# Patient Record
Sex: Female | Born: 1990 | Race: White | Hispanic: No | Marital: Single | Smoking: Never smoker
Health system: Southern US, Community
[De-identification: ages and names within clinical notes are randomized; demographics above are authoritative.]

## PROBLEM LIST (undated history)

## (undated) DIAGNOSIS — Z98891 History of uterine scar from previous surgery: Secondary | ICD-10-CM

## (undated) DIAGNOSIS — Z3491 Encounter for supervision of normal pregnancy, unspecified, first trimester: Principal | ICD-10-CM

## (undated) DIAGNOSIS — Z789 Other specified health status: Secondary | ICD-10-CM

## (undated) HISTORY — DX: History of uterine scar from previous surgery: Z98.891

## (undated) HISTORY — DX: Encounter for supervision of normal pregnancy, unspecified, first trimester: Z34.91

---

## 2013-12-10 ENCOUNTER — Telehealth: Payer: Self-pay | Admitting: Family Medicine

## 2013-12-10 NOTE — Telephone Encounter (Signed)
appt scheduled

## 2014-01-25 ENCOUNTER — Ambulatory Visit (INDEPENDENT_AMBULATORY_CARE_PROVIDER_SITE_OTHER): Payer: Medicaid Other | Admitting: Nurse Practitioner

## 2014-01-25 ENCOUNTER — Encounter (INDEPENDENT_AMBULATORY_CARE_PROVIDER_SITE_OTHER): Payer: Self-pay

## 2014-01-25 ENCOUNTER — Encounter: Payer: Self-pay | Admitting: Nurse Practitioner

## 2014-01-25 VITALS — BP 132/78 | HR 84 | Temp 98.6°F | Ht 63.75 in | Wt 237.0 lb

## 2014-01-25 DIAGNOSIS — Z713 Dietary counseling and surveillance: Secondary | ICD-10-CM

## 2014-01-25 DIAGNOSIS — Z0000 Encounter for general adult medical examination without abnormal findings: Secondary | ICD-10-CM

## 2014-01-25 DIAGNOSIS — N926 Irregular menstruation, unspecified: Secondary | ICD-10-CM

## 2014-01-25 DIAGNOSIS — R635 Abnormal weight gain: Secondary | ICD-10-CM

## 2014-01-25 DIAGNOSIS — Z01419 Encounter for gynecological examination (general) (routine) without abnormal findings: Secondary | ICD-10-CM

## 2014-01-25 LAB — POCT URINALYSIS DIPSTICK
Bilirubin, UA: NEGATIVE
Blood, UA: NEGATIVE
Glucose, UA: NEGATIVE
Ketones, UA: NEGATIVE
Leukocytes, UA: NEGATIVE
Nitrite, UA: NEGATIVE
PH UA: 5
PROTEIN UA: NEGATIVE
SPEC GRAV UA: 1.02
UROBILINOGEN UA: NEGATIVE

## 2014-01-25 LAB — POCT UA - MICROSCOPIC ONLY
BACTERIA, U MICROSCOPIC: NEGATIVE
CRYSTALS, UR, HPF, POC: NEGATIVE
Casts, Ur, LPF, POC: NEGATIVE
Mucus, UA: NEGATIVE
RBC, URINE, MICROSCOPIC: NEGATIVE
WBC, Ur, HPF, POC: NEGATIVE
Yeast, UA: NEGATIVE

## 2014-01-25 LAB — POCT HEMOGLOBIN: Hemoglobin: 14.3 g/dL (ref 12.2–16.2)

## 2014-01-25 MED ORDER — LEVONORGESTREL-ETHINYL ESTRAD 0.1-20 MG-MCG PO TABS: 1.0000 | ORAL_TABLET | Freq: Every day | ORAL | Status: DC

## 2014-01-25 NOTE — Patient Instructions (Signed)
Exercise to Lose Weight Exercise and a healthy diet may help you lose weight. Your doctor may suggest specific exercises. EXERCISE IDEAS AND TIPS  Choose low-cost things you enjoy doing, such as walking, bicycling, or exercising to workout videos.  Take stairs instead of the elevator.  Walk during your lunch break.  Park your car further away from work or school.  Go to a gym or an exercise class.  Start with 5 to 10 minutes of exercise each day. Build up to 30 minutes of exercise 4 to 6 days a week.  Wear shoes with good support and comfortable clothes.  Stretch before and after working out.  Work out until you breathe harder and your heart beats faster.  Drink extra water when you exercise.  Do not do so much that you hurt yourself, feel dizzy, or get very short of breath. Exercises that burn about 150 calories:  Running 1  miles in 15 minutes.  Playing volleyball for 45 to 60 minutes.  Washing and waxing a car for 45 to 60 minutes.  Playing touch football for 45 minutes.  Walking 1  miles in 35 minutes.  Pushing a stroller 1  miles in 30 minutes.  Playing basketball for 30 minutes.  Raking leaves for 30 minutes.  Bicycling 5 miles in 30 minutes.  Walking 2 miles in 30 minutes.  Dancing for 30 minutes.  Shoveling snow for 15 minutes.  Swimming laps for 20 minutes.  Walking up stairs for 15 minutes.  Bicycling 4 miles in 15 minutes.  Gardening for 30 to 45 minutes.  Jumping rope for 15 minutes.  Washing windows or floors for 45 to 60 minutes. Document Released: 06/08/2010 Document Revised: 07/29/2011 Document Reviewed: 06/08/2010 [employer] Patient Information 2015 [employer]. This information is not intended to replace advice given to you by your health care provider. Make sure you discuss any questions you have with your health care provider.  

## 2014-01-25 NOTE — Progress Notes (Signed)
Subjective:    Patient ID: Julie Estes, female    DOB: 23-Apr-1991, 23 y.o.   MRN: [id number]  HPI Patient here for a complete physical with pap.  Had last menstrual cycle in July 2015 that last 1 week and has not had a menstrual cycle since then.  Has had bilateral lower quadrant pain for the past few weeks.  Has not had regular periods since the age of 23.  Was on the birth control pill for about one year and then stopped to get pregnant and has a 34 year old son and currently not using any birth control.  Has had a 75 lb. Weight gain since having her son.   Last pap smear was at age 62 with no abnormal results.    Review of Systems  Constitutional: Positive for unexpected weight change.  Respiratory: Negative.   Cardiovascular: Negative.   Gastrointestinal: Negative.   Musculoskeletal: Negative.   Neurological: Negative.   Psychiatric/Behavioral: Negative.        Objective:   Physical Exam  Constitutional: She is oriented to person, place, and time. She appears well-developed and well-nourished.  HENT:  Head: Normocephalic.  Right Ear: Hearing, tympanic membrane, external ear and ear canal normal.  Left Ear: Hearing, tympanic membrane, external ear and ear canal normal.  Nose: Nose normal.  Mouth/Throat: Uvula is midline and oropharynx is clear and moist.  Eyes: Conjunctivae and EOM are normal. Pupils are equal, round, and reactive to light.  Neck: Normal range of motion and full passive range of motion without pain. Neck supple. No JVD present. Carotid bruit is not present. No mass and no thyromegaly present.  Cardiovascular: Normal rate, normal heart sounds and intact distal pulses.   No murmur heard. Pulmonary/Chest: Effort normal and breath sounds normal. Right breast exhibits no inverted nipple, no mass, no nipple discharge, no skin change and no tenderness. Left breast exhibits no inverted nipple, no mass, no nipple discharge, no skin change and no tenderness.    Abdominal: Soft. Bowel sounds are normal. She exhibits no mass. There is no tenderness.  Genitourinary: Vagina normal and uterus normal. No breast swelling, tenderness, discharge or bleeding.  bimanual exam-No adnexal masses or tenderness. Cervix nonparous and pink  Musculoskeletal: Normal range of motion.  Lymphadenopathy:    She has no cervical adenopathy.  Neurological: She is alert and oriented to person, place, and time.  Skin: Skin is warm and dry.  Psychiatric: She has a normal mood and affect. Her behavior is normal. Judgment and thought content normal.    BP 132/78  Pulse 84  Temp(Src) 98.6 F (37 C) (Oral)  Ht 5' 3.75" (1.619 m)  Wt 237 lb (107.502 kg)  BMI 41.01 kg/m2  LMP 12/13/2013       Assessment & Plan:   1. Annual physical exam   2. Encounter for routine gynecological examination   3. Severe obesity (BMI >= 40)   4. Irregular menses   5. Weight loss counseling, encounter for    Orders Placed This Encounter  Procedures  . POCT urinalysis dipstick  . POCT UA - Microscopic Only  . POCT hemoglobin   Meds ordered this encounter  Medications  . levonorgestrel-ethinyl estradiol (AVIANE) 0.1-20 MG-MCG tablet    Sig: Take 1 tablet by mouth daily.    Dispense:  1 Package    Refill:  11    Order Specific Question:  Supervising Provider    Answer:  Ernestina Penna [[id number]]   Patient told to  take birth control for 3 months- then stop and try to get pregnant agian Discussed weight management for patient with BMI> 25 Labs pending Health maintenance reviewed Diet and exercise encouraged Continue all meds Follow up  In 1 year   Mary-Margaret Daphine Deutscher, FNP

## 2014-01-26 LAB — THYROID PANEL WITH TSH
FREE THYROXINE INDEX: 2.6 (ref 1.2–4.9)
T3 UPTAKE RATIO: 20 % — AB (ref 24–39)
T4 TOTAL: 13.1 ug/dL — AB (ref 4.5–12.0)
TSH: 2.04 u[IU]/mL (ref 0.450–4.500)

## 2014-01-26 LAB — HCG, QUANTITATIVE, PREGNANCY: hCG Quant: <1 m[IU]/mL

## 2014-01-27 LAB — PAP IG, CT-NG, RFX HPV ASCU
Chlamydia, Nuc. Acid Amp: NEGATIVE
Gonococcus by Nucleic Acid Amp: NEGATIVE
PAP SMEAR COMMENT: 0

## 2014-06-09 ENCOUNTER — Telehealth: Payer: Self-pay | Admitting: Nurse Practitioner

## 2014-06-13 ENCOUNTER — Ambulatory Visit: Payer: Medicaid Other | Admitting: Nurse Practitioner

## 2014-06-13 ENCOUNTER — Telehealth: Payer: Self-pay | Admitting: Nurse Practitioner

## 2014-06-13 NOTE — Telephone Encounter (Signed)
Appointment rescheduled for tomorrow with Paulene FloorMary Martin, FNP.

## 2014-06-13 NOTE — Telephone Encounter (Signed)
Scheduled in another encounter. 

## 2014-06-14 ENCOUNTER — Encounter: Payer: Self-pay | Admitting: Nurse Practitioner

## 2014-06-14 ENCOUNTER — Ambulatory Visit (INDEPENDENT_AMBULATORY_CARE_PROVIDER_SITE_OTHER): Payer: Medicaid Other | Admitting: Nurse Practitioner

## 2014-06-14 VITALS — BP 116/80 | HR 86 | Temp 97.1°F | Ht 63.0 in | Wt 235.0 lb

## 2014-06-14 DIAGNOSIS — B373 Candidiasis of vulva and vagina: Secondary | ICD-10-CM

## 2014-06-14 DIAGNOSIS — Z09 Encounter for follow-up examination after completed treatment for conditions other than malignant neoplasm: Secondary | ICD-10-CM

## 2014-06-14 DIAGNOSIS — R300 Dysuria: Secondary | ICD-10-CM

## 2014-06-14 DIAGNOSIS — B3731 Acute candidiasis of vulva and vagina: Secondary | ICD-10-CM

## 2014-06-14 DIAGNOSIS — N3000 Acute cystitis without hematuria: Secondary | ICD-10-CM

## 2014-06-14 DIAGNOSIS — S322XXD Fracture of coccyx, subsequent encounter for fracture with routine healing: Secondary | ICD-10-CM

## 2014-06-14 LAB — POCT URINALYSIS DIPSTICK
Bilirubin, UA: NEGATIVE
GLUCOSE UA: NEGATIVE
KETONES UA: NEGATIVE
NITRITE UA: NEGATIVE
RBC UA: NEGATIVE
UROBILINOGEN UA: NEGATIVE
pH, UA: 6.5

## 2014-06-14 LAB — POCT UA - MICROSCOPIC ONLY
Casts, Ur, LPF, POC: NEGATIVE
Crystals, Ur, HPF, POC: NEGATIVE
RBC, urine, microscopic: NEGATIVE
WBC, Ur, HPF, POC: NEGATIVE

## 2014-06-14 MED ORDER — FLUCONAZOLE 150 MG PO TABS: 150.0000 mg | ORAL_TABLET | Freq: Once | ORAL | Status: DC

## 2014-06-14 MED ORDER — NITROFURANTOIN MONOHYD MACRO 100 MG PO CAPS: 100.0000 mg | ORAL_CAPSULE | Freq: Two times a day (BID) | ORAL | Status: DC

## 2014-06-14 MED ORDER — HYDROCODONE-ACETAMINOPHEN 5-325 MG PO TABS: 1.0000 | ORAL_TABLET | Freq: Four times a day (QID) | ORAL | Status: DC | PRN

## 2014-06-14 NOTE — Progress Notes (Signed)
   Subjective:    Patient ID: Julie ShortsSamantha Estes, female    DOB: Jan 17, 1991, 24 y.o.   MRN: [id number]  HPI [facility] follow [facility] She had fallen out of a big truck and hit  Her tail bone on running board- Diagnosed with coccyx fracture and was given pain pills. She is still having pain. Pain is worse with sitting or laying down  *Also c/o dysuria and urgency- Started yesterday- Had to get up in the middle og night to void which is unusual for her. Denies fever or pelvic pain.  Review of Systems  Constitutional: Negative.   HENT: Negative.   Respiratory: Negative.   Cardiovascular: Negative.   Genitourinary: Positive for dysuria, urgency and frequency. Negative for flank pain.  Neurological: Negative.   Psychiatric/Behavioral: Negative.   All other systems reviewed and are negative.      Objective:   Physical Exam  Constitutional: She is oriented to person, place, and time. She appears well-developed and well-nourished.  Cardiovascular: Normal rate, regular rhythm and normal heart sounds.   Pulmonary/Chest: Effort normal and breath sounds normal.  Abdominal: Soft. Bowel sounds are normal. There is no tenderness.  Genitourinary:  No CVA tenderness  Musculoskeletal:  Tenderness along coccyx bone on very light palpation  Neurological: She is alert and oriented to person, place, and time.  Skin: Skin is warm and dry.  Psychiatric: She has a normal mood and affect. Her behavior is normal. Judgment and thought content normal.   BP 116/80 mmHg  Pulse 86  Temp(Src) 97.1 F (36.2 C) (Oral)  Ht 5\' 3"  (1.6 m)  Wt 235 lb (106.595 kg)  BMI 41.64 kg/m2        Assessment & Plan:  1. Dysuria - POCT UA - Microscopic Only - POCT urinalysis dipstick  2. Acute cystitis without hematuria Force fluids AZO over the counter X2 days RTO prn Culture pending - nitrofurantoin, macrocrystal-monohydrate, (MACROBID) 100 MG capsule;  Take 1 capsule (100 mg total) by mouth 2 (two) times daily.  Dispense: 14 capsule; Refill: 0  3. Vaginal candidiasis - fluconazole (DIFLUCAN) 150 MG tablet; Take 1 tablet (150 mg total) by mouth once.  Dispense: 1 tablet; Refill: 0  4. Fracture of coccyx, with routine healing, subsequent encounter Get a donut to sit on - HYDROcodone-acetaminophen (LORTAB) 5-325 MG per tablet; Take 1 tablet by mouth every 6 (six) hours as needed for moderate pain.  Dispense: 40 tablet; Refill: 0  5. Hospital discharge follow-up Hospital records reviewed  Mary-Margaret Oak CreekMartin, [place]NP

## 2014-06-14 NOTE — Patient Instructions (Signed)

## 2014-07-13 ENCOUNTER — Ambulatory Visit (INDEPENDENT_AMBULATORY_CARE_PROVIDER_SITE_OTHER): Payer: Medicaid Other | Admitting: Family

## 2014-07-13 ENCOUNTER — Encounter: Payer: Self-pay | Admitting: Family

## 2014-07-13 VITALS — BP 120/74 | HR 76 | Temp 97.5°F | Ht 63.0 in | Wt 232.0 lb

## 2014-07-13 DIAGNOSIS — N912 Amenorrhea, unspecified: Secondary | ICD-10-CM

## 2014-07-13 DIAGNOSIS — Z3490 Encounter for supervision of normal pregnancy, unspecified, unspecified trimester: Secondary | ICD-10-CM

## 2014-07-13 LAB — POCT URINE PREGNANCY: Preg Test, Ur: POSITIVE

## 2014-07-13 NOTE — Patient Instructions (Signed)
Second Trimester of Pregnancy The second trimester is from week 13 through week 28, months 4 through 6. The second trimester is often a time when you feel your best. Your body has also adjusted to being pregnant, and you begin to feel better physically. Usually, morning sickness has lessened or quit completely, you may have more energy, and you may have an increase in appetite. The second trimester is also a time when the fetus is growing rapidly. At the end of the sixth month, the fetus is about 9 inches long and weighs about 1 pounds. You will likely begin to feel the baby move (quickening) between 18 and 20 weeks of the pregnancy. BODY CHANGES Your body goes through many changes during pregnancy. The changes vary from woman to woman.   Your weight will continue to increase. You will notice your lower abdomen bulging out.  You may begin to get stretch marks on your hips, abdomen, and breasts.  You may develop headaches that can be relieved by medicines approved by your health care provider.  You may urinate more often because the fetus is pressing on your bladder.  You may develop or continue to have heartburn as a result of your pregnancy.  You may develop constipation because certain hormones are causing the muscles that push waste through your intestines to slow down.  You may develop hemorrhoids or swollen, bulging veins (varicose veins).  You may have back pain because of the weight gain and pregnancy hormones relaxing your joints between the bones in your pelvis and as a result of a shift in weight and the muscles that support your balance.  Your breasts will continue to grow and be tender.  Your gums may bleed and may be sensitive to brushing and flossing.  Dark spots or blotches (chloasma, mask of pregnancy) may develop on your face. This will likely fade after the baby is born.  A dark line from your belly button to the pubic area (linea nigra) may appear. This will likely fade  after the baby is born.  You may have changes in your hair. These can include thickening of your hair, rapid growth, and changes in texture. Some women also have hair loss during or after pregnancy, or hair that feels dry or thin. Your hair will most likely return to normal after your baby is born. WHAT TO EXPECT AT YOUR PRENATAL VISITS During a routine prenatal visit:  You will be weighed to make sure you and the fetus are growing normally.  Your blood pressure will be taken.  Your abdomen will be measured to track your baby's growth.  The fetal heartbeat will be listened to.  Any test results from the previous visit will be discussed. Your health care provider may ask you:  How you are feeling.  If you are feeling the baby move.  If you have had any abnormal symptoms, such as leaking fluid, bleeding, severe headaches, or abdominal cramping.  If you have any questions. Other tests that may be performed during your second trimester include:  Blood tests that check for:  Low iron levels (anemia).  Gestational diabetes (between 24 and 28 weeks).  Rh antibodies.  Urine tests to check for infections, diabetes, or protein in the urine.  An ultrasound to confirm the proper growth and development of the baby.  An amniocentesis to check for possible genetic problems.  Fetal screens for spina bifida and Down syndrome. HOME CARE INSTRUCTIONS   Avoid all smoking, herbs, alcohol, and unprescribed   drugs. These chemicals affect the formation and growth of the baby.  Follow your health care provider's instructions regarding medicine use. There are medicines that are either safe or unsafe to take during pregnancy.  Exercise only as directed by your health care provider. Experiencing uterine cramps is a good sign to stop exercising.  Continue to eat regular, healthy meals.  Wear a good support bra for breast tenderness.  Do not use hot tubs, steam rooms, or saunas.  Wear your  seat belt at all times when driving.  Avoid raw meat, uncooked cheese, cat litter boxes, and soil used by cats. These carry germs that can cause birth defects in the baby.  Take your prenatal vitamins.  Try taking a stool softener (if your health care provider approves) if you develop constipation. Eat more high-fiber foods, such as fresh vegetables or fruit and whole grains. Drink plenty of fluids to keep your urine clear or pale yellow.  Take warm sitz baths to soothe any pain or discomfort caused by hemorrhoids. Use hemorrhoid cream if your health care provider approves.  If you develop varicose veins, wear support hose. Elevate your feet for 15 minutes, 3-4 times a day. Limit salt in your diet.  Avoid heavy lifting, wear low heel shoes, and practice good posture.  Rest with your legs elevated if you have leg cramps or low back pain.  Visit your dentist if you have not gone yet during your pregnancy. Use a soft toothbrush to brush your teeth and be gentle when you floss.  A sexual relationship may be continued unless your health care provider directs you otherwise.  Continue to go to all your prenatal visits as directed by your health care provider. SEEK MEDICAL CARE IF:   You have dizziness.  You have mild pelvic cramps, pelvic pressure, or nagging pain in the abdominal area.  You have persistent nausea, vomiting, or diarrhea.  You have a bad smelling vaginal discharge.  You have pain with urination. SEEK IMMEDIATE MEDICAL CARE IF:   You have a fever.  You are leaking fluid from your vagina.  You have spotting or bleeding from your vagina.  You have severe abdominal cramping or pain.  You have rapid weight gain or loss.  You have shortness of breath with chest pain.  You notice sudden or extreme swelling of your face, hands, ankles, feet, or legs.  You have not felt your baby move in over an hour.  You have severe headaches that do not go away with  medicine.  You have vision changes. Document Released: 04/30/2001 Document Revised: 05/11/2013 Document Reviewed: 07/07/2012 [facility] Patient Information 2015 [place], [place]. This information is not intended to replace advice given to you by your health care provider. Make sure you discuss any questions you have with your health care provider. First Trimester of Pregnancy The first trimester of pregnancy is from week 1 until the end of week 12 (months 1 through 3). A week after a sperm fertilizes an egg, the egg will implant on the wall of the uterus. This embryo will begin to develop into a baby. Genes from you and your partner are forming the baby. The female genes determine whether the baby is a boy or a girl. At 6-8 weeks, the eyes and face are formed, and the heartbeat can be seen on ultrasound. At the end of 12 weeks, all the baby's organs are formed.  Now that you are pregnant, you will want to do everything you can to have  a healthy baby. Two of the most important things are to get good prenatal care and to follow your health care provider's instructions. Prenatal care is all the medical care you receive before the baby's birth. This care will help prevent, find, and treat any problems during the pregnancy and childbirth. BODY CHANGES Your body goes through many changes during pregnancy. The changes vary from woman to woman.   You may gain or lose a couple of pounds at first.  You may feel sick to your stomach (nauseous) and throw up (vomit). If the vomiting is uncontrollable, call your health care provider.  You may tire easily.  You may develop headaches that can be relieved by medicines approved by your health care provider.  You may urinate more often. Painful urination may mean you have a bladder infection.  You may develop heartburn as a result of your pregnancy.  You may develop constipation because certain hormones are causing the muscles that push waste through your intestines  to slow down.  You may develop hemorrhoids or swollen, bulging veins (varicose veins).  Your breasts may begin to grow larger and become tender. Your nipples may stick out more, and the tissue that surrounds them (areola) may become darker.  Your gums may bleed and may be sensitive to brushing and flossing.  Dark spots or blotches (chloasma, mask of pregnancy) may develop on your face. This will likely fade after the baby is born.  Your menstrual periods will stop.  You may have a loss of appetite.  You may develop cravings for certain kinds of food.  You may have changes in your emotions from day to day, such as being excited to be pregnant or being concerned that something may go wrong with the pregnancy and baby.  You may have more vivid and strange dreams.  You may have changes in your hair. These can include thickening of your hair, rapid growth, and changes in texture. Some women also have hair loss during or after pregnancy, or hair that feels dry or thin. Your hair will most likely return to normal after your baby is born. WHAT TO EXPECT AT YOUR PRENATAL VISITS During a routine prenatal visit:  You will be weighed to make sure you and the baby are growing normally.  Your blood pressure will be taken.  Your abdomen will be measured to track your baby's growth.  The fetal heartbeat will be listened to starting around week 10 or 12 of your pregnancy.  Test results from any previous visits will be discussed. Your health care provider may ask you:  How you are feeling.  If you are feeling the baby move.  If you have had any abnormal symptoms, such as leaking fluid, bleeding, severe headaches, or abdominal cramping.  If you have any questions. Other tests that may be performed during your first trimester include:  Blood tests to find your blood type and to check for the presence of any previous infections. They will also be used to check for low iron levels (anemia) and  Rh antibodies. Later in the pregnancy, blood tests for diabetes will be done along with other tests if problems develop.  Urine tests to check for infections, diabetes, or protein in the urine.  An ultrasound to confirm the proper growth and development of the baby.  An amniocentesis to check for possible genetic problems.  Fetal screens for spina bifida and Down syndrome.  You may need other tests to make sure you and the baby  are doing well. HOME CARE INSTRUCTIONS  Medicines  Follow your health care provider's instructions regarding medicine use. Specific medicines may be either safe or unsafe to take during pregnancy.  Take your prenatal vitamins as directed.  If you develop constipation, try taking a stool softener if your health care provider approves. Diet  Eat regular, well-balanced meals. Choose a variety of foods, such as meat or vegetable-based protein, fish, milk and low-fat dairy products, vegetables, fruits, and whole grain breads and cereals. Your health care provider will help you determine the amount of weight gain that is right for you.  Avoid raw meat and uncooked cheese. These carry germs that can cause birth defects in the baby.  Eating four or five small meals rather than three large meals a day may help relieve nausea and vomiting. If you start to feel nauseous, eating a few soda crackers can be helpful. Drinking liquids between meals instead of during meals also seems to help nausea and vomiting.  If you develop constipation, eat more high-fiber foods, such as fresh vegetables or fruit and whole grains. Drink enough fluids to keep your urine clear or pale yellow. Activity and Exercise  Exercise only as directed by your health care provider. Exercising will help you:  Control your weight.  Stay in shape.  Be prepared for labor and delivery.  Experiencing pain or cramping in the lower abdomen or low back is a good sign that you should stop exercising. Check  with your health care provider before continuing normal exercises.  Try to avoid standing for long periods of time. Move your legs often if you must stand in one place for a long time.  Avoid heavy lifting.  Wear low-heeled shoes, and practice good posture.  You may continue to have sex unless your health care provider directs you otherwise. Relief of Pain or Discomfort  Wear a good support bra for breast tenderness.   Take warm sitz baths to soothe any pain or discomfort caused by hemorrhoids. Use hemorrhoid cream if your health care provider approves.   Rest with your legs elevated if you have leg cramps or low back pain.  If you develop varicose veins in your legs, wear support hose. Elevate your feet for 15 minutes, 3-4 times a day. Limit salt in your diet. Prenatal Care  Schedule your prenatal visits by the twelfth week of pregnancy. They are usually scheduled monthly at first, then more often in the last 2 months before delivery.  Write down your questions. Take them to your prenatal visits.  Keep all your prenatal visits as directed by your health care provider. Safety  Wear your seat belt at all times when driving.  Make a list of emergency phone numbers, including numbers for family, friends, the hospital, and police and fire departments. General Tips  Ask your health care provider for a referral to a local prenatal education class. Begin classes no later than at the beginning of month 6 of your pregnancy.  Ask for help if you have counseling or nutritional needs during pregnancy. Your health care provider can offer advice or refer you to specialists for help with various needs.  Do not use hot tubs, steam rooms, or saunas.  Do not douche or use tampons or scented sanitary pads.  Do not cross your legs for long periods of time.  Avoid cat litter boxes and soil used by cats. These carry germs that can cause birth defects in the baby and possibly loss of the fetus  by miscarriage or stillbirth.  Avoid all smoking, herbs, alcohol, and medicines not prescribed by your health care provider. Chemicals in these affect the formation and growth of the baby.  Schedule a dentist appointment. At home, brush your teeth with a soft toothbrush and be gentle when you floss. SEEK MEDICAL CARE IF:   You have dizziness.  You have mild pelvic cramps, pelvic pressure, or nagging pain in the abdominal area.  You have persistent nausea, vomiting, or diarrhea.  You have a bad smelling vaginal discharge.  You have pain with urination.  You notice increased swelling in your face, hands, legs, or ankles. SEEK IMMEDIATE MEDICAL CARE IF:   You have a fever.  You are leaking fluid from your vagina.  You have spotting or bleeding from your vagina.  You have severe abdominal cramping or pain.  You have rapid weight gain or loss.  You vomit blood or material that looks like coffee grounds.  You are exposed to German measles and have never had them.  You are exposed to fifth disease or chickenpox.  You develop a severe headache.  You have shortness of breath.  You have any kind of trauma, such as from a fall or a car accident. Document Released: 04/30/2001 Document Revised: 09/20/2013 Document Reviewed: 03/16/2013 [employer] Patient Information 2015 [employer]. This information is not intended to replace advice given to you by your health care provider. Make sure you discuss any questions you have with your health care provider.  

## 2014-07-13 NOTE — Progress Notes (Signed)
   Subjective:    Patient ID: Julie ShortsSamantha Estes, female    DOB: Feb 05, 1991, 24 y.o.   MRN: [id number]  HPI Pt presents to the office for two home positive pregnancy tests. Pt states she needs a referral to a OB GYN. Pt states she is taking prenatal vitamins. Pt states her periods are irregular and does not know how far along she is. States her last periord was Dec. 18.   Review of Systems  Constitutional: Negative.   HENT: Negative.   Eyes: Negative.   Respiratory: Negative.  Negative for shortness of breath.   Cardiovascular: Negative.  Negative for palpitations.  Gastrointestinal: Negative.   Endocrine: Negative.   Genitourinary: Negative.   Musculoskeletal: Negative.   Neurological: Negative.  Negative for headaches.  Hematological: Negative.   Psychiatric/Behavioral: Negative.   All other systems reviewed and are negative.      Objective:   Physical Exam  Constitutional: She is oriented to person, place, and time. She appears well-developed and well-nourished. No distress.  HENT:  Head: Normocephalic and atraumatic.  Right Ear: External ear normal.  Left Ear: External ear normal.  Nose: Nose normal.  Mouth/Throat: Oropharynx is clear and moist.  Eyes: Pupils are equal, round, and reactive to light.  Neck: Normal range of motion. Neck supple. No thyromegaly present.  Cardiovascular: Normal rate, regular rhythm, normal heart sounds and intact distal pulses.   No murmur heard. Pulmonary/Chest: Effort normal and breath sounds normal. No respiratory distress. She has no wheezes.  Abdominal: Soft. Bowel sounds are normal.  Musculoskeletal: Normal range of motion. She exhibits no edema or tenderness.  Neurological: She is alert and oriented to person, place, and time. She has normal reflexes. No cranial nerve deficit.  Skin: Skin is warm and dry.  Psychiatric: She has a normal mood and affect. Her behavior is normal. Judgment and thought content normal.  Vitals reviewed.   BP  120/74 mmHg  Pulse 76  Temp(Src) 97.5 F (36.4 C) (Oral)  Ht 5\' 3"  (1.6 m)  Wt 232 lb (105.235 kg)  BMI 41.11 kg/m2  LMP 07/11/2013 (Within Days)       Assessment & Plan:  1. Amenorrhea - POCT urine pregnancy - Ambulatory referral to Obstetrics / Gynecology  2. Pregnant -Continue prenatal vitamins -Discussed healthy diet and medications to avoid -Keep OB GYN appt - Ambulatory referral to Obstetrics / Gynecology  Jannifer Rodneyhristy Valmai Vandenberghe, FNP

## 2014-07-14 ENCOUNTER — Other Ambulatory Visit: Payer: Self-pay | Admitting: Obstetrics and Gynecology

## 2014-07-14 DIAGNOSIS — O3680X0 Pregnancy with inconclusive fetal viability, not applicable or unspecified: Secondary | ICD-10-CM

## 2014-07-18 ENCOUNTER — Ambulatory Visit (INDEPENDENT_AMBULATORY_CARE_PROVIDER_SITE_OTHER): Payer: Medicaid Other

## 2014-07-18 ENCOUNTER — Other Ambulatory Visit: Payer: Self-pay | Admitting: Obstetrics and Gynecology

## 2014-07-18 DIAGNOSIS — O3680X0 Pregnancy with inconclusive fetal viability, not applicable or unspecified: Secondary | ICD-10-CM

## 2014-07-18 DIAGNOSIS — O26841 Uterine size-date discrepancy, first trimester: Secondary | ICD-10-CM

## 2014-07-18 NOTE — Progress Notes (Signed)
U/S-single IUP with +FCA noted, FHR-168 bpm, CRL c/w 8+4 wks EDD 02/23/2015, cx appears closed, bilateral adnexa appears WNL with C.L. Noted on the Rt

## 2014-08-01 ENCOUNTER — Encounter: Payer: Medicaid Other | Admitting: Women's Health

## 2014-08-04 ENCOUNTER — Ambulatory Visit (INDEPENDENT_AMBULATORY_CARE_PROVIDER_SITE_OTHER): Payer: Medicaid Other | Admitting: Adult Health

## 2014-08-04 ENCOUNTER — Encounter: Payer: Self-pay | Admitting: Adult Health

## 2014-08-04 VITALS — BP 122/68 | HR 80 | Wt 230.5 lb

## 2014-08-04 DIAGNOSIS — Z3491 Encounter for supervision of normal pregnancy, unspecified, first trimester: Secondary | ICD-10-CM

## 2014-08-04 DIAGNOSIS — Z0283 Encounter for blood-alcohol and blood-drug test: Secondary | ICD-10-CM

## 2014-08-04 DIAGNOSIS — Z369 Encounter for antenatal screening, unspecified: Secondary | ICD-10-CM

## 2014-08-04 DIAGNOSIS — Z331 Pregnant state, incidental: Secondary | ICD-10-CM

## 2014-08-04 DIAGNOSIS — Z3481 Encounter for supervision of other normal pregnancy, first trimester: Secondary | ICD-10-CM

## 2014-08-04 DIAGNOSIS — Z1389 Encounter for screening for other disorder: Secondary | ICD-10-CM

## 2014-08-04 DIAGNOSIS — Z98891 History of uterine scar from previous surgery: Secondary | ICD-10-CM

## 2014-08-04 HISTORY — DX: Encounter for supervision of normal pregnancy, unspecified, first trimester: Z34.91

## 2014-08-04 HISTORY — DX: History of uterine scar from previous surgery: Z98.891

## 2014-08-04 LAB — POCT URINALYSIS DIPSTICK
Glucose, UA: NEGATIVE
Ketones, UA: NEGATIVE
LEUKOCYTES UA: NEGATIVE
Nitrite, UA: NEGATIVE
Protein, UA: NEGATIVE

## 2014-08-04 NOTE — Progress Notes (Signed)
Subjective:  Julie Estes is a 24 y.o. 952P1001 Caucasian female at 5544w0d by US being seen today for her first obstetrical visit.  Her obstetrical history is significant for obesity.  Pregnancy history fully reviewed.  Patient reports no complaints. Denies vb, cramping, uti s/s, abnormal/malodorous vag d/c, or vulvovaginal itching/irritation.  BP 122/68 mmHg  Pulse 80  Wt 230 lb 8 oz (104.554 kg)  LMP 05/06/2014 (Approximate)  HISTORY: OB History  Gravida Para Term Preterm AB SAB TAB Ectopic Multiple Living  2 1 1       1     # Outcome Date GA Lbr Len/2nd Weight Sex Delivery Anes PTL Lv  2 Current           1 Term 03/28/10 3948w0d  8 lb 7.1 oz (3.83 kg) M CS-Unspec   Y     Past Medical History  Diagnosis Date  . Supervision of normal pregnancy in first trimester 08/04/2014  . History of cesarean section 08/04/2014   Past Surgical History  Procedure Laterality Date  . Cesarean section  03/28/2010   Family History  Problem Relation Age of Onset  . Lung disease Father   . Other Mother     herpes  . Hypertension Brother   . Cancer Maternal Grandmother     ovarian  . COPD Maternal Grandfather   . Stroke Paternal Grandmother   . Heart disease Paternal Grandfather     Exam   System:     General: Well developed & nourished, no acute distress   Skin: Warm & dry, normal coloration and turgor, no rashes   Neurologic: Alert & oriented, normal mood   Cardiovascular: Regular rate & rhythm   Respiratory: Effort & rate normal, LCTAB, acyanotic   Abdomen: Soft, non tender   Extremities: normal strength, tone                                        Thyroid normal Pelvic Exam:    Perineum: deferred   Vulva: deferred   Vagina:  deferred   Cervix: deferred   Uterus: deferred   Thin prep pap smear done 01/25/14 was normal FHR: 160 via doppler   Assessment:   Pregnancy: G2P1001 Patient Active Problem List   Diagnosis Date Noted  . Supervision of normal pregnancy in first  trimester 08/04/2014  . History of cesarean section 08/04/2014    4244w0d G2P1001 New OB visit     Plan:  Initial labs drawn Continue prenatal vitamins Problem list reviewed and updated Reviewed n/v relief measures and warning s/s to report Reviewed recommended weight gain based on pre-gravid BMI Encouraged well-balanced diet Genetic Screening discussed Integrated Screen: requested Cystic fibrosis screening discussed requested Ultrasound discussed; fetal survey: requested Follow up in 1 weeks for  IT/NT then in 4 weeks for second IT and see Julie Estes  Julie Geske A. Julie Niebla, NP 08/04/2014 12:28 PM

## 2014-08-04 NOTE — Patient Instructions (Signed)
Second Trimester of Pregnancy The second trimester is from week 13 through week 28, months 4 through 6. The second trimester is often a time when you feel your best. Your body has also adjusted to being pregnant, and you begin to feel better physically. Usually, morning sickness has lessened or quit completely, you may have more energy, and you may have an increase in appetite. The second trimester is also a time when the fetus is growing rapidly. At the end of the sixth month, the fetus is about 9 inches long and weighs about 1 pounds. You will likely begin to feel the baby move (quickening) between 18 and 20 weeks of the pregnancy. BODY CHANGES Your body goes through many changes during pregnancy. The changes vary from woman to woman.   Your weight will continue to increase. You will notice your lower abdomen bulging out.  You may begin to get stretch marks on your hips, abdomen, and breasts.  You may develop headaches that can be relieved by medicines approved by your health care provider.  You may urinate more often because the fetus is pressing on your bladder.  You may develop or continue to have heartburn as a result of your pregnancy.  You may develop constipation because certain hormones are causing the muscles that push waste through your intestines to slow down.  You may develop hemorrhoids or swollen, bulging veins (varicose veins).  You may have back pain because of the weight gain and pregnancy hormones relaxing your joints between the bones in your pelvis and as a result of a shift in weight and the muscles that support your balance.  Your breasts will continue to grow and be tender.  Your gums may bleed and may be sensitive to brushing and flossing.  Dark spots or blotches (chloasma, mask of pregnancy) may develop on your face. This will likely fade after the baby is born.  A dark line from your belly button to the pubic area (linea nigra) may appear. This will likely fade  after the baby is born.  You may have changes in your hair. These can include thickening of your hair, rapid growth, and changes in texture. Some women also have hair loss during or after pregnancy, or hair that feels dry or thin. Your hair will most likely return to normal after your baby is born. WHAT TO EXPECT AT YOUR PRENATAL VISITS During a routine prenatal visit:  You will be weighed to make sure you and the fetus are growing normally.  Your blood pressure will be taken.  Your abdomen will be measured to track your baby's growth.  The fetal heartbeat will be listened to.  Any test results from the previous visit will be discussed. Your health care provider may ask you:  How you are feeling.  If you are feeling the baby move.  If you have had any abnormal symptoms, such as leaking fluid, bleeding, severe headaches, or abdominal cramping.  If you have any questions. Other tests that may be performed during your second trimester include:  Blood tests that check for:  Low iron levels (anemia).  Gestational diabetes (between 24 and 28 weeks).  Rh antibodies.  Urine tests to check for infections, diabetes, or protein in the urine.  An ultrasound to confirm the proper growth and development of the baby.  An amniocentesis to check for possible genetic problems.  Fetal screens for spina bifida and Down syndrome. HOME CARE INSTRUCTIONS   Avoid all smoking, herbs, alcohol, and unprescribed   drugs. These chemicals affect the formation and growth of the baby.  Follow your health care provider's instructions regarding medicine use. There are medicines that are either safe or unsafe to take during pregnancy.  Exercise only as directed by your health care provider. Experiencing uterine cramps is a good sign to stop exercising.  Continue to eat regular, healthy meals.  Wear a good support bra for breast tenderness.  Do not use hot tubs, steam rooms, or saunas.  Wear your  seat belt at all times when driving.  Avoid raw meat, uncooked cheese, cat litter boxes, and soil used by cats. These carry germs that can cause birth defects in the baby.  Take your prenatal vitamins.  Try taking a stool softener (if your health care provider approves) if you develop constipation. Eat more high-fiber foods, such as fresh vegetables or fruit and whole grains. Drink plenty of fluids to keep your urine clear or pale yellow.  Take warm sitz baths to soothe any pain or discomfort caused by hemorrhoids. Use hemorrhoid cream if your health care provider approves.  If you develop varicose veins, wear support hose. Elevate your feet for 15 minutes, 3-4 times a day. Limit salt in your diet.  Avoid heavy lifting, wear low heel shoes, and practice good posture.  Rest with your legs elevated if you have leg cramps or low back pain.  Visit your dentist if you have not gone yet during your pregnancy. Use a soft toothbrush to brush your teeth and be gentle when you floss.  A sexual relationship may be continued unless your health care provider directs you otherwise.  Continue to go to all your prenatal visits as directed by your health care provider. SEEK MEDICAL CARE IF:   You have dizziness.  You have mild pelvic cramps, pelvic pressure, or nagging pain in the abdominal area.  You have persistent nausea, vomiting, or diarrhea.  You have a bad smelling vaginal discharge.  You have pain with urination. SEEK IMMEDIATE MEDICAL CARE IF:   You have a fever.  You are leaking fluid from your vagina.  You have spotting or bleeding from your vagina.  You have severe abdominal cramping or pain.  You have rapid weight gain or loss.  You have shortness of breath with chest pain.  You notice sudden or extreme swelling of your face, hands, ankles, feet, or legs.  You have not felt your baby move in over an hour.  You have severe headaches that do not go away with  medicine.  You have vision changes. Document Released: 04/30/2001 Document Revised: 05/11/2013 Document Reviewed: 07/07/2012 [facility] Patient Information 2015 [place], [place]. This information is not intended to replace advice given to you by your health care provider. Make sure you discuss any questions you have with your health care provider. First Trimester of Pregnancy The first trimester of pregnancy is from week 1 until the end of week 12 (months 1 through 3). A week after a sperm fertilizes an egg, the egg will implant on the wall of the uterus. This embryo will begin to develop into a baby. Genes from you and your partner are forming the baby. The female genes determine whether the baby is a boy or a girl. At 6-8 weeks, the eyes and face are formed, and the heartbeat can be seen on ultrasound. At the end of 12 weeks, all the baby's organs are formed.  Now that you are pregnant, you will want to do everything you can to have  a healthy baby. Two of the most important things are to get good prenatal care and to follow your health care provider's instructions. Prenatal care is all the medical care you receive before the baby's birth. This care will help prevent, find, and treat any problems during the pregnancy and childbirth. BODY CHANGES Your body goes through many changes during pregnancy. The changes vary from woman to woman.   You may gain or lose a couple of pounds at first.  You may feel sick to your stomach (nauseous) and throw up (vomit). If the vomiting is uncontrollable, call your health care provider.  You may tire easily.  You may develop headaches that can be relieved by medicines approved by your health care provider.  You may urinate more often. Painful urination may mean you have a bladder infection.  You may develop heartburn as a result of your pregnancy.  You may develop constipation because certain hormones are causing the muscles that push waste through your intestines  to slow down.  You may develop hemorrhoids or swollen, bulging veins (varicose veins).  Your breasts may begin to grow larger and become tender. Your nipples may stick out more, and the tissue that surrounds them (areola) may become darker.  Your gums may bleed and may be sensitive to brushing and flossing.  Dark spots or blotches (chloasma, mask of pregnancy) may develop on your face. This will likely fade after the baby is born.  Your menstrual periods will stop.  You may have a loss of appetite.  You may develop cravings for certain kinds of food.  You may have changes in your emotions from day to day, such as being excited to be pregnant or being concerned that something may go wrong with the pregnancy and baby.  You may have more vivid and strange dreams.  You may have changes in your hair. These can include thickening of your hair, rapid growth, and changes in texture. Some women also have hair loss during or after pregnancy, or hair that feels dry or thin. Your hair will most likely return to normal after your baby is born. WHAT TO EXPECT AT YOUR PRENATAL VISITS During a routine prenatal visit:  You will be weighed to make sure you and the baby are growing normally.  Your blood pressure will be taken.  Your abdomen will be measured to track your baby's growth.  The fetal heartbeat will be listened to starting around week 10 or 12 of your pregnancy.  Test results from any previous visits will be discussed. Your health care provider may ask you:  How you are feeling.  If you are feeling the baby move.  If you have had any abnormal symptoms, such as leaking fluid, bleeding, severe headaches, or abdominal cramping.  If you have any questions. Other tests that may be performed during your first trimester include:  Blood tests to find your blood type and to check for the presence of any previous infections. They will also be used to check for low iron levels (anemia) and  Rh antibodies. Later in the pregnancy, blood tests for diabetes will be done along with other tests if problems develop.  Urine tests to check for infections, diabetes, or protein in the urine.  An ultrasound to confirm the proper growth and development of the baby.  An amniocentesis to check for possible genetic problems.  Fetal screens for spina bifida and Down syndrome.  You may need other tests to make sure you and the baby  are doing well. HOME CARE INSTRUCTIONS  Medicines  Follow your health care provider's instructions regarding medicine use. Specific medicines may be either safe or unsafe to take during pregnancy.  Take your prenatal vitamins as directed.  If you develop constipation, try taking a stool softener if your health care provider approves. Diet  Eat regular, well-balanced meals. Choose a variety of foods, such as meat or vegetable-based protein, fish, milk and low-fat dairy products, vegetables, fruits, and whole grain breads and cereals. Your health care provider will help you determine the amount of weight gain that is right for you.  Avoid raw meat and uncooked cheese. These carry germs that can cause birth defects in the baby.  Eating four or five small meals rather than three large meals a day may help relieve nausea and vomiting. If you start to feel nauseous, eating a few soda crackers can be helpful. Drinking liquids between meals instead of during meals also seems to help nausea and vomiting.  If you develop constipation, eat more high-fiber foods, such as fresh vegetables or fruit and whole grains. Drink enough fluids to keep your urine clear or pale yellow. Activity and Exercise  Exercise only as directed by your health care provider. Exercising will help you:  Control your weight.  Stay in shape.  Be prepared for labor and delivery.  Experiencing pain or cramping in the lower abdomen or low back is a good sign that you should stop exercising. Check  with your health care provider before continuing normal exercises.  Try to avoid standing for long periods of time. Move your legs often if you must stand in one place for a long time.  Avoid heavy lifting.  Wear low-heeled shoes, and practice good posture.  You may continue to have sex unless your health care provider directs you otherwise. Relief of Pain or Discomfort  Wear a good support bra for breast tenderness.   Take warm sitz baths to soothe any pain or discomfort caused by hemorrhoids. Use hemorrhoid cream if your health care provider approves.   Rest with your legs elevated if you have leg cramps or low back pain.  If you develop varicose veins in your legs, wear support hose. Elevate your feet for 15 minutes, 3-4 times a day. Limit salt in your diet. Prenatal Care  Schedule your prenatal visits by the twelfth week of pregnancy. They are usually scheduled monthly at first, then more often in the last 2 months before delivery.  Write down your questions. Take them to your prenatal visits.  Keep all your prenatal visits as directed by your health care provider. Safety  Wear your seat belt at all times when driving.  Make a list of emergency phone numbers, including numbers for family, friends, the hospital, and police and fire departments. General Tips  Ask your health care provider for a referral to a local prenatal education class. Begin classes no later than at the beginning of month 6 of your pregnancy.  Ask for help if you have counseling or nutritional needs during pregnancy. Your health care provider can offer advice or refer you to specialists for help with various needs.  Do not use hot tubs, steam rooms, or saunas.  Do not douche or use tampons or scented sanitary pads.  Do not cross your legs for long periods of time.  Avoid cat litter boxes and soil used by cats. These carry germs that can cause birth defects in the baby and possibly loss of the fetus  by miscarriage or stillbirth.  Avoid all smoking, herbs, alcohol, and medicines not prescribed by your health care provider. Chemicals in these affect the formation and growth of the baby.  Schedule a dentist appointment. At home, brush your teeth with a soft toothbrush and be gentle when you floss. SEEK MEDICAL CARE IF:   You have dizziness.  You have mild pelvic cramps, pelvic pressure, or nagging pain in the abdominal area.  You have persistent nausea, vomiting, or diarrhea.  You have a bad smelling vaginal discharge.  You have pain with urination.  You notice increased swelling in your face, hands, legs, or ankles. SEEK IMMEDIATE MEDICAL CARE IF:   You have a fever.  You are leaking fluid from your vagina.  You have spotting or bleeding from your vagina.  You have severe abdominal cramping or pain.  You have rapid weight gain or loss.  You vomit blood or material that looks like coffee grounds.  You are exposed to [place] measles and have never had them.  You are exposed to fifth disease or chickenpox.  You develop a severe headache.  You have shortness of breath.  You have any kind of trauma, such as from a fall or a car accident. Document Released: 04/30/2001 Document Revised: 09/20/2013 Document Reviewed: 03/16/2013 [facility] Patient Information 2015 [place]. This information is not intended to replace advice given to you by your health care provider. Make sure you discuss any questions you have with your health care provider. Return in 1 week for IT/NT then 4 weeks for second IT and see Drenda FreezeFran

## 2014-08-05 LAB — VARICELLA ZOSTER ANTIBODY, IGG: Varicella zoster IgG: 2968 index (ref >165)

## 2014-08-06 LAB — GC/CHLAMYDIA PROBE AMP
CHLAMYDIA, DNA PROBE: NEGATIVE
NEISSERIA GONORRHOEAE BY PCR: NEGATIVE

## 2014-08-06 LAB — URINE CULTURE

## 2014-08-09 LAB — PMP SCREEN PROFILE (10S), URINE
Amphetamine Screen, Ur: NEGATIVE ng/mL
Barbiturate Screen, Ur: NEGATIVE ng/mL
Benzodiazepine Screen, Urine: NEGATIVE ng/mL
CREATININE(CRT), U: 160.5 mg/dL (ref 20.0–300.0)
Cannabinoids Ur Ql Scn: NEGATIVE ng/mL
Cocaine(Metab.)Screen, Urine: NEGATIVE ng/mL
METHADONE SCREEN, URINE: NEGATIVE ng/mL
OPIATE SCRN UR: NEGATIVE ng/mL
OXYCODONE+OXYMORPHONE UR QL SCN: NEGATIVE ng/mL
PCP SCRN UR: NEGATIVE ng/mL
PH UR, DRUG SCRN: 6.2 (ref 4.5–8.9)
Propoxyphene, Screen: NEGATIVE ng/mL

## 2014-08-09 LAB — CBC
HCT: 40.9 % (ref 34.0–46.6)
Hemoglobin: 13.5 g/dL (ref 11.1–15.9)
MCH: 29.3 pg (ref 26.6–33.0)
MCHC: 33 g/dL (ref 31.5–35.7)
MCV: 89 fL (ref 79–97)
PLATELETS: 377 10*3/uL (ref 150–379)
RBC: 4.61 x10E6/uL (ref 3.77–5.28)
RDW: 13 % (ref 12.3–15.4)
WBC: 8.3 10*3/uL (ref 3.4–10.8)

## 2014-08-09 LAB — RPR: RPR: NONREACTIVE

## 2014-08-09 LAB — CYSTIC FIBROSIS MUTATION 97: Interpretation: NOT DETECTED

## 2014-08-09 LAB — URINALYSIS, ROUTINE W REFLEX MICROSCOPIC
Bilirubin, UA: NEGATIVE
Glucose, UA: NEGATIVE
KETONES UA: NEGATIVE
LEUKOCYTES UA: NEGATIVE
Nitrite, UA: NEGATIVE
PROTEIN UA: NEGATIVE
RBC UA: NEGATIVE
Specific Gravity, UA: 1.017 (ref 1.005–1.030)
UUROB: 0.2 mg/dL (ref 0.2–1.0)
pH, UA: 6.5 (ref 5.0–7.5)

## 2014-08-09 LAB — HEPATITIS B SURFACE ANTIGEN: Hepatitis B Surface Ag: NEGATIVE

## 2014-08-09 LAB — TSH: TSH: 0.753 u[IU]/mL (ref 0.450–4.500)

## 2014-08-09 LAB — ABO/RH: Rh Factor: POSITIVE

## 2014-08-09 LAB — HIV ANTIBODY (ROUTINE TESTING W REFLEX): HIV Screen 4th Generation wRfx: NONREACTIVE

## 2014-08-09 LAB — ANTIBODY SCREEN: Antibody Screen: NEGATIVE

## 2014-08-09 LAB — RUBELLA SCREEN: RUBELLA: 6.91 {index} (ref >0.99)

## 2014-08-11 ENCOUNTER — Other Ambulatory Visit: Payer: Medicaid Other

## 2014-08-11 ENCOUNTER — Ambulatory Visit (INDEPENDENT_AMBULATORY_CARE_PROVIDER_SITE_OTHER): Payer: Medicaid Other

## 2014-08-11 DIAGNOSIS — Z369 Encounter for antenatal screening, unspecified: Secondary | ICD-10-CM

## 2014-08-11 DIAGNOSIS — Z36 Encounter for antenatal screening of mother: Secondary | ICD-10-CM

## 2014-08-11 DIAGNOSIS — Z3491 Encounter for supervision of normal pregnancy, unspecified, first trimester: Secondary | ICD-10-CM

## 2014-08-11 NOTE — Progress Notes (Signed)
US [redacted]W[redacted]D IUP POS FHT,ant pl,cx appears closed,normal ovs, nasal bone present,nt 1.6678mm

## 2014-08-15 LAB — MATERNAL SCREEN, INTEGRATED #1
Crown Rump Length: 61.4 mm
GEST. AGE ON COLLECTION DATE: 12.6 wk
MATERNAL AGE AT EDD: 25 a
NUMBER OF FETUSES: 1
Nuchal Translucency (NT): 1.7 mm
PAPP-A VALUE: 223.7 ng/mL
Weight: 231 [lb_av]

## 2014-09-01 ENCOUNTER — Encounter: Payer: Self-pay | Admitting: Advanced Practice Midwife

## 2014-09-01 ENCOUNTER — Ambulatory Visit (INDEPENDENT_AMBULATORY_CARE_PROVIDER_SITE_OTHER): Payer: Medicaid Other | Admitting: Advanced Practice Midwife

## 2014-09-01 VITALS — BP 122/70 | HR 76 | Wt 230.0 lb

## 2014-09-01 DIAGNOSIS — Z3491 Encounter for supervision of normal pregnancy, unspecified, first trimester: Secondary | ICD-10-CM

## 2014-09-01 DIAGNOSIS — Z3481 Encounter for supervision of other normal pregnancy, first trimester: Secondary | ICD-10-CM

## 2014-09-01 DIAGNOSIS — Z369 Encounter for antenatal screening, unspecified: Secondary | ICD-10-CM

## 2014-09-01 DIAGNOSIS — Z363 Encounter for antenatal screening for malformations: Secondary | ICD-10-CM

## 2014-09-01 DIAGNOSIS — Z1389 Encounter for screening for other disorder: Secondary | ICD-10-CM

## 2014-09-01 DIAGNOSIS — Z331 Pregnant state, incidental: Secondary | ICD-10-CM

## 2014-09-01 LAB — POCT URINALYSIS DIPSTICK
Blood, UA: NEGATIVE
Glucose, UA: NEGATIVE
Ketones, UA: NEGATIVE
LEUKOCYTES UA: NEGATIVE
Nitrite, UA: NEGATIVE
PROTEIN UA: NEGATIVE

## 2014-09-01 NOTE — Progress Notes (Signed)
G2P1001 7537w0d Estimated Date of Delivery: 02/23/15  Blood pressure 122/70, pulse 76, weight 230 lb (104.327 kg), last menstrual period 05/06/2014.   BP weight and urine results all reviewed and noted.  Please refer to the obstetrical flow sheet for the fundal height and fetal heart rate documentation:  Patient denies any bleeding and no rupture of membranes symptoms or regular contractions. Patient is without complaints. She started exercising and decreasing carbs in August and lost 45 LBs. Continues to walk and eat healthy All questions were answered.  Plan:  Continued routine obstetrical care, 2nd IT Follow up in 4 weeks for OB appointment, anatomy scan

## 2014-09-01 NOTE — Progress Notes (Signed)
Pt states that she has had a headache for 2 days and pain on her lower right side.

## 2014-09-03 LAB — MATERNAL SCREEN, INTEGRATED #2
AFP MARKER: 22.3 ng/mL
AFP MoM: 1.07
CROWN RUMP LENGTH: 61.4 mm
DIA MoM: 1.09
DIA VALUE: 161.4 pg/mL
Estriol, Unconjugated: 0.68 ng/mL
Gest. Age on Collection Date: 12.6 weeks
Gestational Age: 15.6 weeks
HCG VALUE: 25.8 [IU]/mL
Maternal Age at EDD: 25 years
NUMBER OF FETUSES: 1
Nuchal Translucency (NT): 1.7 mm
Nuchal Translucency MoM: 1.31
PAPP-A MOM: 0.37
PAPP-A Value: 223.7 ng/mL
TEST RESULTS: NEGATIVE
WEIGHT: 231 [lb_av]
Weight: 230 [lb_av]
hCG MoM: 0.89
uE3 MoM: 1.02

## 2014-09-22 ENCOUNTER — Other Ambulatory Visit: Payer: Self-pay | Admitting: Advanced Practice Midwife

## 2014-09-22 DIAGNOSIS — Z363 Encounter for antenatal screening for malformations: Secondary | ICD-10-CM

## 2014-09-22 DIAGNOSIS — O99212 Obesity complicating pregnancy, second trimester: Secondary | ICD-10-CM

## 2014-09-29 ENCOUNTER — Other Ambulatory Visit: Payer: Medicaid Other

## 2014-09-29 ENCOUNTER — Encounter: Payer: Medicaid Other | Admitting: Obstetrics and Gynecology

## 2014-09-30 ENCOUNTER — Ambulatory Visit (INDEPENDENT_AMBULATORY_CARE_PROVIDER_SITE_OTHER): Payer: Medicaid Other | Admitting: Obstetrics and Gynecology

## 2014-09-30 ENCOUNTER — Encounter: Payer: Self-pay | Admitting: Obstetrics and Gynecology

## 2014-09-30 ENCOUNTER — Ambulatory Visit (INDEPENDENT_AMBULATORY_CARE_PROVIDER_SITE_OTHER): Payer: Medicaid Other

## 2014-09-30 VITALS — BP 100/64 | HR 96 | Wt 229.0 lb

## 2014-09-30 DIAGNOSIS — O99212 Obesity complicating pregnancy, second trimester: Secondary | ICD-10-CM

## 2014-09-30 DIAGNOSIS — Z3491 Encounter for supervision of normal pregnancy, unspecified, first trimester: Secondary | ICD-10-CM

## 2014-09-30 DIAGNOSIS — Z1389 Encounter for screening for other disorder: Secondary | ICD-10-CM

## 2014-09-30 DIAGNOSIS — Z36 Encounter for antenatal screening of mother: Secondary | ICD-10-CM | POA: Diagnosis not present

## 2014-09-30 DIAGNOSIS — Z331 Pregnant state, incidental: Secondary | ICD-10-CM

## 2014-09-30 DIAGNOSIS — Z3481 Encounter for supervision of other normal pregnancy, first trimester: Secondary | ICD-10-CM

## 2014-09-30 DIAGNOSIS — Z363 Encounter for antenatal screening for malformations: Secondary | ICD-10-CM

## 2014-09-30 LAB — POCT URINALYSIS DIPSTICK
Blood, UA: NEGATIVE
Glucose, UA: NEGATIVE
Ketones, UA: NEGATIVE
Leukocytes, UA: NEGATIVE
NITRITE UA: NEGATIVE
Protein, UA: NEGATIVE

## 2014-09-30 NOTE — Progress Notes (Signed)
Patient ID: Julie Estes, female   DOB: [birth year], 24 y.o.   MRN: [id number]  G2P1001 1224w1d Estimated Date of Delivery: 02/23/15  Blood pressure 100/64, pulse 96, weight 229 lb (103.874 kg), last menstrual period 05/06/2014.   refer to the ob flow sheet for FH and FHR, also BP, Wt, Urine results:notable for negative Patient reports  + good fetal movement, denies any bleeding and no rupture of membranes symptoms or regular contractions. Patient complaints: Patient has no concerns at this time. This is her second child; [place]  By c/s She doesn't want any children after this child, and she plans to use BCP for contraception. Given info on LARC's  Questions were answered. Assessment: Pregnancy 3624w1d, LROB, U/S normal                       Prior c/s declining TOLAC  Plan:  Continued routine obstetrical care.  Alternative contraceptive methods discussed, including IUD.  F/u in 4 weeks for pnx care  This chart was SCRIBED for Christin BachJohn Cailean Heacock, MD by Ronney LionSuzanne Le, ED Scribe. This patient was seen in room 3 and the patient's care was started at 11:11 AM.  I personally performed the services described in this documentation, which was SCRIBED in my presence. The recorded information has been reviewed and considered accurate. It has been edited as necessary during review. Tilda BurrowFERGUSON,Jonmarc Bodkin V, MD

## 2014-09-30 NOTE — Progress Notes (Signed)
Detailed anatomy ultrasound completed today. Single, active female fetus in a breech presentation. FHR 152 bpm.  Cervix is closed and measures 3.8cm. Fluid is normal with SVP of 3.77cm. Anterior Gr 1 placenta. EFW today of 280 g which is consistent with dating. All anatomy visualized and appears normal today. Left ovary appears normal. Right ovary not well visualized today.

## 2014-09-30 NOTE — Progress Notes (Signed)
Pt denies any problems or concerns at this time.  

## 2014-10-28 ENCOUNTER — Ambulatory Visit (INDEPENDENT_AMBULATORY_CARE_PROVIDER_SITE_OTHER): Payer: Medicaid Other | Admitting: Obstetrics and Gynecology

## 2014-10-28 VITALS — BP 100/60 | HR 84 | Wt 232.4 lb

## 2014-10-28 DIAGNOSIS — Z331 Pregnant state, incidental: Secondary | ICD-10-CM

## 2014-10-28 DIAGNOSIS — Z3A23 23 weeks gestation of pregnancy: Secondary | ICD-10-CM

## 2014-10-28 DIAGNOSIS — Z98891 History of uterine scar from previous surgery: Secondary | ICD-10-CM

## 2014-10-28 DIAGNOSIS — Z1389 Encounter for screening for other disorder: Secondary | ICD-10-CM

## 2014-10-28 DIAGNOSIS — Z3482 Encounter for supervision of other normal pregnancy, second trimester: Secondary | ICD-10-CM

## 2014-10-28 LAB — POCT URINALYSIS DIPSTICK
Glucose, UA: NEGATIVE
KETONES UA: NEGATIVE
Leukocytes, UA: NEGATIVE
NITRITE UA: NEGATIVE
PROTEIN UA: NEGATIVE
RBC UA: 2

## 2014-10-28 NOTE — Progress Notes (Signed)
Patient ID: Julie Estes, female   DOB: 25-Mar-1991, 25 y.o.   MRN: [id number]  G2P1001 [redacted]w[redacted]d Estimated Date of Delivery: 02/23/15  Blood pressure 100/60, pulse 84, weight 232 lb 6.4 oz (105.416 kg), last menstrual period 05/06/2014.   refer to the ob flow sheet for FH and FHR, also BP, Wt, Urine results:notable for negative  Patient reports  + good fetal movement, denies any bleeding and no rupture of membranes symptoms or regular contractions. Patient complaints: Pt denies any complains at this time. She has gained 3 lbs since the start of the pregnancy.   FH-29 cm FHR-147 bpm  Questions were answered. Assessment: [redacted]w[redacted]d, G2P1001  Plan:  Continued routine obstetrical care.  F/u in 4 weeks for routine pre-natal care pn2   This chart was scribed for Christin Bach, MD by Gwenyth Ober, ED Scribe. This patient was seen in room 3 and the patient's care was started at 11:10 AM.   I personally performed the services described in this documentation, which was SCRIBED in my presence. The recorded information has been reviewed and considered accurate. It has been edited as necessary during review. [name], MD

## 2014-10-31 ENCOUNTER — Encounter: Payer: Medicaid Other | Admitting: Obstetrics & Gynecology

## 2014-11-25 ENCOUNTER — Ambulatory Visit (INDEPENDENT_AMBULATORY_CARE_PROVIDER_SITE_OTHER): Payer: Medicaid Other | Admitting: Obstetrics and Gynecology

## 2014-11-25 ENCOUNTER — Other Ambulatory Visit: Payer: Medicaid Other

## 2014-11-25 ENCOUNTER — Encounter: Payer: Self-pay | Admitting: Obstetrics and Gynecology

## 2014-11-25 VITALS — BP 110/66 | HR 88 | Wt 236.0 lb

## 2014-11-25 DIAGNOSIS — Z3402 Encounter for supervision of normal first pregnancy, second trimester: Secondary | ICD-10-CM

## 2014-11-25 DIAGNOSIS — Z1389 Encounter for screening for other disorder: Secondary | ICD-10-CM

## 2014-11-25 DIAGNOSIS — Z331 Pregnant state, incidental: Secondary | ICD-10-CM

## 2014-11-25 DIAGNOSIS — Z131 Encounter for screening for diabetes mellitus: Secondary | ICD-10-CM

## 2014-11-25 DIAGNOSIS — Z369 Encounter for antenatal screening, unspecified: Secondary | ICD-10-CM

## 2014-11-25 LAB — POCT URINALYSIS DIPSTICK
Blood, UA: NEGATIVE
Glucose, UA: NEGATIVE
KETONES UA: NEGATIVE
LEUKOCYTES UA: NEGATIVE
Nitrite, UA: NEGATIVE

## 2014-11-25 NOTE — Progress Notes (Signed)
Pt denies any problems or concerns at this time.  

## 2014-11-25 NOTE — Progress Notes (Signed)
Patient ID: Julie ShortsSamantha Estes, female   DOB: 11-03-1990, 24 y.o.   MRN: [id number]  G2P1001 4944w1d Estimated Date of Delivery: 02/23/15  Blood pressure 110/66, pulse 88, weight 236 lb (107.049 kg), last menstrual period 05/06/2014.   refer to the ob flow sheet for FH and FHR, also BP, Wt, Urine results:notable for negative  Patient reports  + good fetal movement, denies any bleeding and no rupture of membranes symptoms or regular contractions. Patient complaints: Pt denies any complaints at this time. She is undergoing Diabetes testing today.  FH-29 cm FHR- 146 bpm Questions were answered. Assessment: 6044w1d, G2P1001 prior c/s for repeat  Plan:  Continued routine obstetrical care.  F/u in 4 weeks for routine pre-natal care   This chart was scribed for Julie BurrowJohn Irean Kendricks V, MD by Julie Estes, ED Scribe. This patient was seen in room 3 and the patient's care was started at 10:26 AM.   I personally performed the services described in this documentation, which was SCRIBED in my presence. The recorded information has been reviewed and considered accurate. It has been edited as necessary during review. Julie Estes,Julie Gahm V, MD

## 2014-11-26 LAB — CBC
HEMOGLOBIN: 12 g/dL (ref 11.1–15.9)
Hematocrit: 35 % (ref 34.0–46.6)
MCH: 29.9 pg (ref 26.6–33.0)
MCHC: 34.3 g/dL (ref 31.5–35.7)
MCV: 87 fL (ref 79–97)
Platelets: 391 10*3/uL — ABNORMAL HIGH (ref 150–379)
RBC: 4.02 x10E6/uL (ref 3.77–5.28)
RDW: 14 % (ref 12.3–15.4)
WBC: 9.7 10*3/uL (ref 3.4–10.8)

## 2014-11-26 LAB — ANTIBODY SCREEN: Antibody Screen: NEGATIVE

## 2014-11-26 LAB — GLUCOSE TOLERANCE, 2 HOURS W/ 1HR
GLUCOSE, 1 HOUR: 80 mg/dL (ref 65–179)
GLUCOSE, FASTING: 81 mg/dL (ref 65–91)
Glucose, 2 hour: 90 mg/dL (ref 65–152)

## 2014-11-26 LAB — HSV 2 ANTIBODY, IGG: HSV 2 Glycoprotein G Ab, IgG: <0.91 index (ref 0.00–0.90)

## 2014-11-26 LAB — HIV ANTIBODY (ROUTINE TESTING W REFLEX): HIV SCREEN 4TH GENERATION: NONREACTIVE

## 2014-11-26 LAB — RPR: RPR: NONREACTIVE

## 2014-12-26 ENCOUNTER — Encounter: Payer: Self-pay | Admitting: Obstetrics and Gynecology

## 2014-12-26 ENCOUNTER — Ambulatory Visit (INDEPENDENT_AMBULATORY_CARE_PROVIDER_SITE_OTHER): Payer: Medicaid Other | Admitting: Obstetrics and Gynecology

## 2014-12-26 VITALS — BP 120/74 | HR 80 | Wt 239.0 lb

## 2014-12-26 DIAGNOSIS — Z331 Pregnant state, incidental: Secondary | ICD-10-CM

## 2014-12-26 DIAGNOSIS — Z1389 Encounter for screening for other disorder: Secondary | ICD-10-CM

## 2014-12-26 DIAGNOSIS — Z3493 Encounter for supervision of normal pregnancy, unspecified, third trimester: Secondary | ICD-10-CM

## 2014-12-26 LAB — POCT URINALYSIS DIPSTICK
Blood, UA: NEGATIVE
GLUCOSE UA: NEGATIVE
KETONES UA: NEGATIVE
LEUKOCYTES UA: NEGATIVE
Nitrite, UA: NEGATIVE

## 2014-12-26 NOTE — Progress Notes (Signed)
Pt denies any problems or concerns at this time.  G2P1001 [redacted]w[redacted]d Estimated Date of Delivery: 02/23/15  Blood pressure 120/74, pulse 80, weight 239 lb (108.41 kg), last menstrual period 05/06/2014.   refer to the ob flow sheet for FH and FHR, also BP, Wt,  Urine results:notable for neg protein,  Patient reports   good fetal movement, denies any bleeding and no rupture of membranes symptoms or regular contractions. Patient complaints:baby very active.  Questions were answered. Assessment:  Plan:  Continued routine obstetrical care, with repeat c/s at 39 mwk  also wants BTL at c/s, will sign papers.  F/u in 2 weeks for pnx

## 2015-01-09 ENCOUNTER — Encounter: Payer: Medicaid Other | Admitting: Obstetrics & Gynecology

## 2015-01-09 ENCOUNTER — Ambulatory Visit (INDEPENDENT_AMBULATORY_CARE_PROVIDER_SITE_OTHER): Payer: Medicaid Other | Admitting: Obstetrics and Gynecology

## 2015-01-09 ENCOUNTER — Encounter: Payer: Self-pay | Admitting: Obstetrics and Gynecology

## 2015-01-09 VITALS — BP 120/76 | HR 80 | Wt 240.5 lb

## 2015-01-09 DIAGNOSIS — Z3493 Encounter for supervision of normal pregnancy, unspecified, third trimester: Secondary | ICD-10-CM

## 2015-01-09 DIAGNOSIS — Z1389 Encounter for screening for other disorder: Secondary | ICD-10-CM

## 2015-01-09 DIAGNOSIS — Z331 Pregnant state, incidental: Secondary | ICD-10-CM

## 2015-01-09 LAB — POCT URINALYSIS DIPSTICK
Blood, UA: NEGATIVE
Glucose, UA: NEGATIVE
KETONES UA: NEGATIVE
LEUKOCYTES UA: NEGATIVE
NITRITE UA: NEGATIVE
Protein, UA: NEGATIVE

## 2015-01-09 NOTE — Progress Notes (Signed)
Pt denies any problems or concerns at this time.  

## 2015-01-09 NOTE — Progress Notes (Signed)
G2P1001 [redacted]w[redacted]d Estimated Date of Delivery: 02/23/15  Blood pressure 120/76, pulse 80, weight 240 lb 8 oz (109.09 kg), last menstrual period 05/06/2014.   refer to the ob flow sheet for FH and FHR, also BP, Wt, Urine results:notable for negative  Patient reports   good fetal movement, denies any bleeding and no rupture of membranes symptoms or regular contractions. Patient complaints:none.  FH: 34 FHR: 131  Questions were answered. Assessment:  Plan:  Continued routine obstetrical care, Repeat Cesarean  And Btl, sept 29 to be determined time  F/u in 2 weeks for lrob

## 2015-01-25 ENCOUNTER — Ambulatory Visit (INDEPENDENT_AMBULATORY_CARE_PROVIDER_SITE_OTHER): Payer: Medicaid Other | Admitting: Obstetrics and Gynecology

## 2015-01-25 ENCOUNTER — Encounter: Payer: Self-pay | Admitting: Obstetrics and Gynecology

## 2015-01-25 VITALS — BP 114/76 | HR 100 | Wt 243.0 lb

## 2015-01-25 DIAGNOSIS — Z3481 Encounter for supervision of other normal pregnancy, first trimester: Secondary | ICD-10-CM

## 2015-01-25 DIAGNOSIS — Z3491 Encounter for supervision of normal pregnancy, unspecified, first trimester: Secondary | ICD-10-CM

## 2015-01-25 DIAGNOSIS — Z98891 History of uterine scar from previous surgery: Secondary | ICD-10-CM

## 2015-01-25 DIAGNOSIS — Z1389 Encounter for screening for other disorder: Secondary | ICD-10-CM

## 2015-01-25 DIAGNOSIS — Z331 Pregnant state, incidental: Secondary | ICD-10-CM

## 2015-01-25 LAB — POCT URINALYSIS DIPSTICK
GLUCOSE UA: NEGATIVE
Ketones, UA: NEGATIVE
LEUKOCYTES UA: NEGATIVE
Nitrite, UA: NEGATIVE
RBC UA: NEGATIVE

## 2015-01-25 NOTE — Progress Notes (Signed)
Pt denies any problems or concerns at this time.  

## 2015-01-25 NOTE — Progress Notes (Addendum)
This chart was scribed for [name], MD by Jarvis Morgan, ED Scribe. This patient was seen in room 2 and the patient's care was started at 2:23 PM.  G2P1001 [redacted]w[redacted]d Estimated Date of Delivery: 02/23/15  Blood pressure 114/76, pulse 100, weight 243 lb (110.224 kg), last menstrual period 05/06/2014.   refer to the ob flow sheet for FH and FHR, also BP, Wt, Urine results:negative  Patient reports  + good fetal movement, denies any bleeding and no rupture of membranes symptoms or regular contractions.  Patient complaints: no complaints at this time. Pt desires repeat c/s scheduled for 9/29 at 3:00 PM to be performed by Dr. Emelda Fear  FHR: 146 FH: 38 cm Edema: none  Questions were answered. Assessment: [redacted]w[redacted]d G2P1001  Plan:  Continued routine obstetrical care   F/u in 1 weeks for group B strep testing  I personally performed the services described in this documentation, which was SCRIBED in my presence. The recorded information has been reviewed and considered accurate. It has been edited as necessary during review. [name], MD

## 2015-02-06 ENCOUNTER — Encounter: Payer: Self-pay | Admitting: Women's Health

## 2015-02-06 ENCOUNTER — Ambulatory Visit (INDEPENDENT_AMBULATORY_CARE_PROVIDER_SITE_OTHER): Payer: Medicaid Other | Admitting: Women's Health

## 2015-02-06 VITALS — BP 120/76 | HR 88 | Wt 242.0 lb

## 2015-02-06 DIAGNOSIS — Z331 Pregnant state, incidental: Secondary | ICD-10-CM

## 2015-02-06 DIAGNOSIS — Z1389 Encounter for screening for other disorder: Secondary | ICD-10-CM

## 2015-02-06 DIAGNOSIS — Z369 Encounter for antenatal screening, unspecified: Secondary | ICD-10-CM

## 2015-02-06 DIAGNOSIS — Z3481 Encounter for supervision of other normal pregnancy, first trimester: Secondary | ICD-10-CM

## 2015-02-06 DIAGNOSIS — Z3491 Encounter for supervision of normal pregnancy, unspecified, first trimester: Secondary | ICD-10-CM

## 2015-02-06 LAB — POCT URINALYSIS DIPSTICK
Blood, UA: NEGATIVE
GLUCOSE UA: NEGATIVE
KETONES UA: NEGATIVE
NITRITE UA: NEGATIVE

## 2015-02-06 NOTE — Progress Notes (Signed)
Low-risk OB appointment G2P1001 [redacted]w[redacted]d Estimated Date of Delivery: 02/23/15 BP 120/76 mmHg  Pulse 88  Wt 242 lb (109.77 kg)  LMP 05/06/2014 (Approximate)  BP, weight, and urine reviewed.  Refer to obstetrical flow sheet for FH & FHR.  Reports good fm.  Denies regular uc's, lof, vb, or uti s/s. Bilateral arm/leg pain/numbness over weekend GBS collected SVE per request: outer os 1-2/inner os closed/50/-2, soft, vtx Reviewed labor s/s, fkc. Plan:  Continue routine obstetrical care  F/U in 1wk for OB appointment

## 2015-02-06 NOTE — Progress Notes (Signed)
Pt states that her arms and legs have hurt really bad the past two days. Pt states that she also has had pain on her lower right side.

## 2015-02-06 NOTE — Patient Instructions (Signed)
Call the office ([phone]) or go to [facility] if:  You begin to have strong, frequent contractions  Your water breaks.  Sometimes it is a big gush of fluid, sometimes it is just a trickle that keeps getting your panties wet or running down your legs  You have vaginal bleeding.  It is normal to have a small amount of spotting if your cervix was checked.   You don't feel your baby moving like normal.  If you don't, get you something to eat and drink and lay down and focus on feeling your baby move.  You should feel at least 10 movements in 2 hours.  If you don't, you should call the office or go to [facility].    Braxton Hicks Contractions Contractions of the uterus can occur throughout pregnancy. Contractions are not always a sign that you are in labor.  WHAT ARE BRAXTON HICKS CONTRACTIONS?  Contractions that occur before labor are called Braxton Hicks contractions, or false labor. Toward the end of pregnancy (32-34 weeks), these contractions can develop more often and may become more forceful. This is not true labor because these contractions do not result in opening (dilatation) and thinning of the cervix. They are sometimes difficult to tell apart from true labor because these contractions can be forceful and people have different pain tolerances. You should not feel embarrassed if you go to the hospital with false labor. Sometimes, the only way to tell if you are in true labor is for your health care provider to look for changes in the cervix. If there are no prenatal problems or other health problems associated with the pregnancy, it is completely safe to be sent home with false labor and await the onset of true labor. HOW CAN YOU TELL THE DIFFERENCE BETWEEN TRUE AND FALSE LABOR? False Labor  The contractions of false labor are usually shorter and not as hard as those of true labor.   The contractions are usually irregular.   The contractions are often felt in the front of  the lower abdomen and in the groin.   The contractions may go away when you walk around or change positions while lying down.   The contractions get weaker and are shorter lasting as time goes on.   The contractions do not usually become progressively stronger, regular, and closer together as with true labor.  True Labor  Contractions in true labor last 30-70 seconds, become very regular, usually become more intense, and increase in frequency.   The contractions do not go away with walking.   The discomfort is usually felt in the top of the uterus and spreads to the lower abdomen and low back.   True labor can be determined by your health care provider with an exam. This will show that the cervix is dilating and getting thinner.  WHAT TO REMEMBER  Keep up with your usual exercises and follow other instructions given by your health care provider.   Take medicines as directed by your health care provider.   Keep your regular prenatal appointments.   Eat and drink lightly if you think you are going into labor.   If Braxton Hicks contractions are making you uncomfortable:   Change your position from lying down or resting to walking, or from walking to resting.   Sit and rest in a tub of warm water.   Drink 2-3 glasses of water. Dehydration may cause these contractions.   Do slow and deep breathing several times an hour.    WHEN SHOULD I SEEK IMMEDIATE MEDICAL CARE? Seek immediate medical care if:  Your contractions become stronger, more regular, and closer together.   You have fluid leaking or gushing from your vagina.   You have a fever.   You pass blood-tinged mucus.   You have vaginal bleeding.   You have continuous abdominal pain.   You have low back pain that you never had before.   You feel your baby's head pushing down and causing pelvic pressure.   Your baby is not moving as much as it used to.  Document Released: 05/06/2005 Document  Revised: 05/11/2013 Document Reviewed: 02/15/2013 [employer] Patient Information 2015 [employer]. This information is not intended to replace advice given to you by your health care provider. Make sure you discuss any questions you have with your health care provider.  

## 2015-02-07 LAB — GC/CHLAMYDIA PROBE AMP
Chlamydia trachomatis, NAA: NEGATIVE
NEISSERIA GONORRHOEAE BY PCR: NEGATIVE

## 2015-02-08 LAB — STREP GP B NAA+RFLX: STREP GP B NAA+RFLX: NEGATIVE

## 2015-02-13 ENCOUNTER — Encounter: Payer: Self-pay | Admitting: Obstetrics and Gynecology

## 2015-02-13 ENCOUNTER — Ambulatory Visit (INDEPENDENT_AMBULATORY_CARE_PROVIDER_SITE_OTHER): Payer: Medicaid Other | Admitting: Obstetrics and Gynecology

## 2015-02-13 VITALS — BP 118/76 | HR 92 | Wt 247.0 lb

## 2015-02-13 DIAGNOSIS — Z331 Pregnant state, incidental: Secondary | ICD-10-CM

## 2015-02-13 DIAGNOSIS — Z3493 Encounter for supervision of normal pregnancy, unspecified, third trimester: Secondary | ICD-10-CM

## 2015-02-13 DIAGNOSIS — Z1389 Encounter for screening for other disorder: Secondary | ICD-10-CM

## 2015-02-13 LAB — POCT URINALYSIS DIPSTICK
GLUCOSE UA: NEGATIVE
KETONES UA: NEGATIVE
Leukocytes, UA: NEGATIVE
NITRITE UA: NEGATIVE
Protein, UA: NEGATIVE
RBC UA: NEGATIVE

## 2015-02-13 NOTE — Progress Notes (Signed)
Patient ID: Julie Estes, female   DOB: 25-Aug-1990, 25 y.o.   MRN: [id number]  G2P1001 [redacted]w[redacted]d Estimated Date of Delivery: 02/23/15  Blood pressure 118/76, pulse 92, weight 247 lb (112.038 kg), last menstrual period 05/06/2014.   refer to the ob flow sheet for FH and FHR, also BP, Wt, Urine results:notable for negative  Patient reports  + good fetal movement, denies any bleeding and no rupture of membranes symptoms or regular contractions. Patient complaints: Pt is currently taking penicillin for a tooth infection. She has a previous cesarean section. Pt is planning on tubal ligation.   FHR-155 bpm FH-39 cm  Questions were answered. Assessment: [redacted]w[redacted]d; G2P1001 prior cesarean section, desiring repeat cesarean section also desire for permanent sterilization  Plan:  Continued routine obstetrical care Repeat Cesarean Section and Tubal Ligation 02/16/2015 9 AM  F/u in 2 weeks for routine postop care   This chart was scribed for [name], MD by Gwenyth Ober, Medical Scribe. This patient was seen in room 1 and the patient's care was started at 4:05 PM.   I personally performed the services described in this documentation, which was SCRIBED in my presence. The recorded information has been reviewed and considered accurate. It has been edited as necessary during review. [name], MD

## 2015-02-13 NOTE — Progress Notes (Signed)
Pt states that she has an infection in her tooth and that her dentist gave her an antibiotic, pt wants to discuss this with Dr. Emelda Fear.

## 2015-02-14 ENCOUNTER — Encounter (HOSPITAL_COMMUNITY)
Admission: RE | Admit: 2015-02-14 | Discharge: 2015-02-14 | Disposition: A | Discharge status: Home / Self Care | Payer: Medicaid Other | Source: Ambulatory Visit | Attending: Obstetrics and Gynecology | Admitting: Obstetrics and Gynecology

## 2015-02-14 ENCOUNTER — Other Ambulatory Visit: Payer: Self-pay | Admitting: Obstetrics and Gynecology

## 2015-02-14 ENCOUNTER — Encounter (HOSPITAL_COMMUNITY): Payer: Self-pay

## 2015-02-14 VITALS — BP 123/86 | HR 96 | Ht 64.0 in

## 2015-02-14 DIAGNOSIS — Z01818 Encounter for other preprocedural examination: Secondary | ICD-10-CM | POA: Insufficient documentation

## 2015-02-14 DIAGNOSIS — Z3491 Encounter for supervision of normal pregnancy, unspecified, first trimester: Secondary | ICD-10-CM

## 2015-02-14 HISTORY — DX: Other specified health status: Z78.9

## 2015-02-14 NOTE — Patient Instructions (Signed)
Your procedure is scheduled on:02/16/15  Enter through the Main Entrance at :9am Pick up desk phone and dial [phone] and inform us of your arrival.  Please call [phone] if you have any problems the morning of surgery.  Remember: Do not eat food or drink liquids after midnight:WED  Water is ok until 5am on Thursday  You may brush your teeth the morning of surgery.   DO NOT wear jewelry, eye make-up, lipstick,body lotion, or dark fingernail polish.  (Polished toes are ok) You may wear deodorant.  If you are to be admitted after surgery, leave suitcase in car until your room has been assigned. Patients discharged on the day of surgery will not be allowed to drive home. Wear loose fitting, comfortable clothes for your ride home.

## 2015-02-14 NOTE — H&P (Signed)
Julie Estes is a 24 y.o. female presenting for repeat cesarean section and bilateral tubal ligation. History OB History    Gravida Para Term Preterm AB TAB SAB Ectopic Multiple Living   Past Medical History  Diagnosis Date  . Supervision of normal pregnancy in first trimester 08/04/2014  . History of cesarean section 08/04/2014  . Medical history non-contributory    Past Surgical History  Procedure Laterality Date  . Cesarean section  03/28/2010    X1   Family History: family history includes COPD in her maternal grandfather; Cancer in her maternal grandmother; Heart disease in her paternal grandfather; Hypertension in her brother; Lung disease in her father; Other in her mother; Stroke in her paternal grandmother. Social History:  reports that she has never smoked. She has never used smokeless tobacco. She reports that she does not drink alcohol or use illicit drugs.   Prenatal Transfer Tool  Maternal Diabetes: No Genetic Screening: Normal Maternal Ultrasounds/Referrals: Normal Fetal Ultrasounds or other Referrals:  None Maternal Substance Abuse:  No Significant Maternal Medications:  None Significant Maternal Lab Results:  Lab values include: Group B Strep negative Other Comments:  None  Review of Systems  Constitutional: Negative.   Eyes: Negative.   Respiratory: Negative.   Cardiovascular: Negative.       Last menstrual period 05/06/2014. Exam Physical Exam  Constitutional: She is oriented to person, place, and time. She appears well-developed and well-nourished.  HENT:  Head: Normocephalic.  Eyes: Pupils are equal, round, and reactive to light.  Neck: Normal range of motion.  Cardiovascular: Normal rate.   Respiratory: Effort normal.  GI: Soft.  Gravid uterus consistent with dates  Genitourinary: Vagina normal.  Musculoskeletal: Normal range of motion.  Neurological: She is alert and oriented to person, place, and time. She has normal  reflexes.  Psychiatric: She has a normal mood and affect. Her behavior is normal.   LMP 05/06/2014 (Approximate)  Prenatal labs: ABO, Rh: A/--/-- (03/17 1212) Antibody: Negative (07/08 0913) Rubella:   RPR: Non Reactive (07/08 0913)  HBsAg: Negative (03/17 1212)  HIV:    GBS:   negative  Assessment/Plan: Pregnancy 39 weeks prior cesarean section after trial of labor desire for elective permanent sterilization Plan: Repeat cesarean section and bilateral tubal ligation 02/16/2015 9 AM   FERGUSON,JOHN V 02/14/2015, 6:07 PM

## 2015-02-15 LAB — CBC
HCT: 37.2 % (ref 36.0–46.0)
Hemoglobin: 11.7 g/dL — ABNORMAL LOW (ref 12.0–15.0)
MCH: 28.2 pg (ref 26.0–34.0)
MCHC: 31.5 g/dL (ref 30.0–36.0)
MCV: 89.6 fL (ref 78.0–100.0)
PLATELETS: 331 10*3/uL (ref 150–400)
RBC: 4.15 MIL/uL (ref 3.87–5.11)
RDW: 15.4 % (ref 11.5–15.5)
WBC: 8.4 10*3/uL (ref 4.0–10.5)

## 2015-02-15 LAB — ABO/RH: ABO/RH(D): A POS

## 2015-02-15 LAB — TYPE AND SCREEN
ABO/RH(D): A POS
Antibody Screen: NEGATIVE

## 2015-02-16 ENCOUNTER — Encounter (HOSPITAL_COMMUNITY)
Admission: RE | Disposition: A | Discharge status: Home / Self Care | Payer: Self-pay | Source: Ambulatory Visit | Attending: Obstetrics and Gynecology

## 2015-02-16 ENCOUNTER — Inpatient Hospital Stay (HOSPITAL_COMMUNITY): Payer: Medicaid Other | Admitting: Anesthesiology

## 2015-02-16 ENCOUNTER — Encounter (HOSPITAL_COMMUNITY): Payer: Self-pay | Admitting: Anesthesiology

## 2015-02-16 ENCOUNTER — Inpatient Hospital Stay (HOSPITAL_COMMUNITY)
Admission: RE | Admit: 2015-02-16 | Discharge: 2015-02-18 | DRG: 766 | Disposition: A | Discharge status: Home / Self Care | Payer: Medicaid Other | Source: Ambulatory Visit | Attending: Obstetrics and Gynecology | Admitting: Obstetrics and Gynecology

## 2015-02-16 DIAGNOSIS — Z3491 Encounter for supervision of normal pregnancy, unspecified, first trimester: Secondary | ICD-10-CM

## 2015-02-16 DIAGNOSIS — Z3A39 39 weeks gestation of pregnancy: Secondary | ICD-10-CM | POA: Diagnosis not present

## 2015-02-16 DIAGNOSIS — O34211 Maternal care for low transverse scar from previous cesarean delivery: Principal | ICD-10-CM | POA: Diagnosis present

## 2015-02-16 DIAGNOSIS — Z98891 History of uterine scar from previous surgery: Secondary | ICD-10-CM

## 2015-02-16 DIAGNOSIS — Z302 Encounter for sterilization: Secondary | ICD-10-CM | POA: Insufficient documentation

## 2015-02-16 DIAGNOSIS — O3421 Maternal care for scar from previous cesarean delivery: Secondary | ICD-10-CM | POA: Diagnosis not present

## 2015-02-16 LAB — URINALYSIS, ROUTINE W REFLEX MICROSCOPIC
BILIRUBIN URINE: NEGATIVE
GLUCOSE, UA: NEGATIVE mg/dL
HGB URINE DIPSTICK: NEGATIVE
Ketones, ur: NEGATIVE mg/dL
Nitrite: NEGATIVE
PH: 7 (ref 5.0–8.0)
Protein, ur: NEGATIVE mg/dL
SPECIFIC GRAVITY, URINE: 1.02 (ref 1.005–1.030)
Urobilinogen, UA: 0.2 mg/dL (ref 0.0–1.0)

## 2015-02-16 LAB — HEPATIC FUNCTION PANEL
ALBUMIN: 3.3 g/dL — AB (ref 3.5–5.0)
ALK PHOS: 141 U/L — AB (ref 38–126)
ALT: 12 U/L — ABNORMAL LOW (ref 14–54)
AST: 16 U/L (ref 15–41)
BILIRUBIN TOTAL: 0.6 mg/dL (ref 0.3–1.2)
Total Protein: 7 g/dL (ref 6.5–8.1)

## 2015-02-16 LAB — RPR: RPR: NONREACTIVE

## 2015-02-16 LAB — URINE MICROSCOPIC-ADD ON

## 2015-02-16 SURGERY — Surgical Case
Anesthesia: Spinal | Laterality: Bilateral

## 2015-02-16 MED ORDER — OXYTOCIN 40 UNITS IN LACTATED RINGERS INFUSION - SIMPLE MED: 62.5000 mL/h | INTRAVENOUS | Status: AC

## 2015-02-16 MED ORDER — LACTATED RINGERS IV SOLN
INTRAVENOUS | Status: DC
  Administered 2015-02-16 (×3): via INTRAVENOUS

## 2015-02-16 MED ORDER — ONDANSETRON HCL 4 MG/2ML IJ SOLN
INTRAMUSCULAR | Status: DC | PRN
  Administered 2015-02-16: 4 mg via INTRAVENOUS

## 2015-02-16 MED ORDER — IBUPROFEN 600 MG PO TABS
600.0000 mg | ORAL_TABLET | Freq: Four times a day (QID) | ORAL | Status: DC
  Administered 2015-02-16 – 2015-02-18 (×8): 600 mg via ORAL
  Filled 2015-02-16 (×8): qty 1

## 2015-02-16 MED ORDER — KETOROLAC TROMETHAMINE 30 MG/ML IJ SOLN
INTRAMUSCULAR | Status: AC
  Filled 2015-02-16: qty 1

## 2015-02-16 MED ORDER — OXYCODONE-ACETAMINOPHEN 5-325 MG PO TABS
1.0000 | ORAL_TABLET | ORAL | Status: DC | PRN
  Administered 2015-02-17: 1 via ORAL
  Filled 2015-02-16 (×3): qty 1

## 2015-02-16 MED ORDER — FENTANYL CITRATE (PF) 100 MCG/2ML IJ SOLN
INTRAMUSCULAR | Status: AC
  Filled 2015-02-16: qty 4

## 2015-02-16 MED ORDER — LACTATED RINGERS IV SOLN
40.0000 [IU] | INTRAVENOUS | Status: DC | PRN
  Administered 2015-02-16: 40 [IU] via INTRAVENOUS

## 2015-02-16 MED ORDER — FENTANYL CITRATE (PF) 100 MCG/2ML IJ SOLN
INTRAMUSCULAR | Status: DC | PRN
  Administered 2015-02-16: 20 ug via INTRATHECAL

## 2015-02-16 MED ORDER — FENTANYL CITRATE (PF) 100 MCG/2ML IJ SOLN: 25.0000 ug | INTRAMUSCULAR | Status: DC | PRN

## 2015-02-16 MED ORDER — TETANUS-DIPHTH-ACELL PERTUSSIS 5-2.5-18.5 LF-MCG/0.5 IM SUSP
0.5000 mL | Freq: Once | INTRAMUSCULAR | Status: AC
  Administered 2015-02-18: 0.5 mL via INTRAMUSCULAR

## 2015-02-16 MED ORDER — NALOXONE HCL 1 MG/ML IJ SOLN
1.0000 ug/kg/h | INTRAVENOUS | Status: DC | PRN
  Filled 2015-02-16: qty 2

## 2015-02-16 MED ORDER — SCOPOLAMINE 1 MG/3DAYS TD PT72: 1.0000 | MEDICATED_PATCH | Freq: Once | TRANSDERMAL | Status: DC

## 2015-02-16 MED ORDER — MORPHINE SULFATE (PF) 0.5 MG/ML IJ SOLN
INTRAMUSCULAR | Status: DC | PRN
  Administered 2015-02-16: .2 mg via INTRATHECAL

## 2015-02-16 MED ORDER — 0.9 % SODIUM CHLORIDE (POUR BTL) OPTIME
TOPICAL | Status: DC | PRN
  Administered 2015-02-16: 1000 mL

## 2015-02-16 MED ORDER — SIMETHICONE 80 MG PO CHEW
80.0000 mg | CHEWABLE_TABLET | ORAL | Status: DC
  Administered 2015-02-17 – 2015-02-18 (×2): 80 mg via ORAL
  Filled 2015-02-16 (×2): qty 1

## 2015-02-16 MED ORDER — ZOLPIDEM TARTRATE 5 MG PO TABS: 5.0000 mg | ORAL_TABLET | Freq: Every evening | ORAL | Status: DC | PRN

## 2015-02-16 MED ORDER — DIPHENHYDRAMINE HCL 50 MG/ML IJ SOLN: 12.5000 mg | INTRAMUSCULAR | Status: DC | PRN

## 2015-02-16 MED ORDER — SODIUM CHLORIDE 0.9 % IJ SOLN: 3.0000 mL | INTRAMUSCULAR | Status: DC | PRN

## 2015-02-16 MED ORDER — SIMETHICONE 80 MG PO CHEW
80.0000 mg | CHEWABLE_TABLET | Freq: Three times a day (TID) | ORAL | Status: DC
  Administered 2015-02-16 – 2015-02-18 (×5): 80 mg via ORAL
  Filled 2015-02-16 (×4): qty 1

## 2015-02-16 MED ORDER — SCOPOLAMINE 1 MG/3DAYS TD PT72
1.0000 | MEDICATED_PATCH | Freq: Once | TRANSDERMAL | Status: DC
  Administered 2015-02-16: 1.5 mg via TRANSDERMAL

## 2015-02-16 MED ORDER — OXYTOCIN 10 UNIT/ML IJ SOLN
INTRAMUSCULAR | Status: AC
  Filled 2015-02-16: qty 4

## 2015-02-16 MED ORDER — ONDANSETRON HCL 4 MG/2ML IJ SOLN
INTRAMUSCULAR | Status: AC
  Filled 2015-02-16: qty 2

## 2015-02-16 MED ORDER — DEXAMETHASONE SODIUM PHOSPHATE 4 MG/ML IJ SOLN
INTRAMUSCULAR | Status: DC | PRN
  Administered 2015-02-16: 4 mg via INTRAVENOUS

## 2015-02-16 MED ORDER — NALBUPHINE HCL 10 MG/ML IJ SOLN
INTRAMUSCULAR | Status: AC
  Filled 2015-02-16: qty 1

## 2015-02-16 MED ORDER — PHENYLEPHRINE HCL 10 MG/ML IJ SOLN
INTRAMUSCULAR | Status: DC | PRN
  Administered 2015-02-16 (×2): 80 ug via INTRAVENOUS

## 2015-02-16 MED ORDER — NALBUPHINE HCL 10 MG/ML IJ SOLN
5.0000 mg | Freq: Once | INTRAMUSCULAR | Status: DC | PRN
  Filled 2015-02-16: qty 0.5

## 2015-02-16 MED ORDER — NALBUPHINE HCL 10 MG/ML IJ SOLN
5.0000 mg | INTRAMUSCULAR | Status: DC | PRN
  Administered 2015-02-16: 5 mg via SUBCUTANEOUS
  Filled 2015-02-16: qty 0.5

## 2015-02-16 MED ORDER — INFLUENZA VAC SPLIT QUAD 0.5 ML IM SUSY
0.5000 mL | PREFILLED_SYRINGE | INTRAMUSCULAR | Status: AC
  Administered 2015-02-18: 0.5 mL via INTRAMUSCULAR

## 2015-02-16 MED ORDER — DIBUCAINE 1 % RE OINT: 1.0000 "application " | TOPICAL_OINTMENT | RECTAL | Status: DC | PRN

## 2015-02-16 MED ORDER — PHENYLEPHRINE 8 MG IN D5W 100 ML (0.08MG/ML) PREMIX OPTIME
INJECTION | INTRAVENOUS | Status: DC | PRN
  Administered 2015-02-16: 60 ug/min via INTRAVENOUS

## 2015-02-16 MED ORDER — SCOPOLAMINE 1 MG/3DAYS TD PT72
MEDICATED_PATCH | TRANSDERMAL | Status: AC
  Administered 2015-02-16: 1.5 mg via TRANSDERMAL
  Filled 2015-02-16: qty 1

## 2015-02-16 MED ORDER — BUPIVACAINE IN DEXTROSE 0.75-8.25 % IT SOLN
INTRATHECAL | Status: DC | PRN
  Administered 2015-02-16: 1.6 mL via INTRATHECAL

## 2015-02-16 MED ORDER — CEFAZOLIN SODIUM-DEXTROSE 2-3 GM-% IV SOLR
INTRAVENOUS | Status: AC
  Filled 2015-02-16: qty 50

## 2015-02-16 MED ORDER — KETOROLAC TROMETHAMINE 30 MG/ML IJ SOLN
30.0000 mg | Freq: Four times a day (QID) | INTRAMUSCULAR | Status: AC | PRN
  Administered 2015-02-16: 30 mg via INTRAVENOUS

## 2015-02-16 MED ORDER — NALBUPHINE HCL 10 MG/ML IJ SOLN
5.0000 mg | INTRAMUSCULAR | Status: DC | PRN
  Filled 2015-02-16: qty 0.5

## 2015-02-16 MED ORDER — ACETAMINOPHEN 325 MG PO TABS: 650.0000 mg | ORAL_TABLET | ORAL | Status: DC | PRN

## 2015-02-16 MED ORDER — WITCH HAZEL-GLYCERIN EX PADS: 1.0000 "application " | MEDICATED_PAD | CUTANEOUS | Status: DC | PRN

## 2015-02-16 MED ORDER — DEXAMETHASONE SODIUM PHOSPHATE 4 MG/ML IJ SOLN
INTRAMUSCULAR | Status: AC
Start: 2015-02-16 — End: 2015-02-16
  Filled 2015-02-16: qty 1

## 2015-02-16 MED ORDER — LANOLIN HYDROUS EX OINT
1.0000 | TOPICAL_OINTMENT | CUTANEOUS | Status: DC | PRN
Start: 2015-02-16 — End: 2015-02-18

## 2015-02-16 MED ORDER — SIMETHICONE 80 MG PO CHEW
80.0000 mg | CHEWABLE_TABLET | ORAL | Status: DC | PRN
  Administered 2015-02-18: 80 mg via ORAL

## 2015-02-16 MED ORDER — OXYCODONE-ACETAMINOPHEN 5-325 MG PO TABS
2.0000 | ORAL_TABLET | ORAL | Status: DC | PRN
  Administered 2015-02-16 – 2015-02-18 (×7): 2 via ORAL
  Filled 2015-02-16 (×6): qty 2

## 2015-02-16 MED ORDER — DIPHENHYDRAMINE HCL 25 MG PO CAPS
25.0000 mg | ORAL_CAPSULE | ORAL | Status: DC | PRN
  Administered 2015-02-17: 25 mg via ORAL
  Filled 2015-02-16 (×2): qty 1

## 2015-02-16 MED ORDER — PHENYLEPHRINE 8 MG IN D5W 100 ML (0.08MG/ML) PREMIX OPTIME
INJECTION | INTRAVENOUS | Status: AC
  Filled 2015-02-16: qty 100

## 2015-02-16 MED ORDER — MENTHOL 3 MG MT LOZG: 1.0000 | LOZENGE | OROMUCOSAL | Status: DC | PRN

## 2015-02-16 MED ORDER — DIPHENHYDRAMINE HCL 25 MG PO CAPS
25.0000 mg | ORAL_CAPSULE | Freq: Four times a day (QID) | ORAL | Status: DC | PRN
Start: 2015-02-16 — End: 2015-02-18

## 2015-02-16 MED ORDER — NALOXONE HCL 0.4 MG/ML IJ SOLN: 0.4000 mg | INTRAMUSCULAR | Status: DC | PRN

## 2015-02-16 MED ORDER — CEFAZOLIN SODIUM-DEXTROSE 2-3 GM-% IV SOLR
2.0000 g | INTRAVENOUS | Status: AC
  Administered 2015-02-16: 2 g via INTRAVENOUS

## 2015-02-16 MED ORDER — PRENATAL MULTIVITAMIN CH
1.0000 | ORAL_TABLET | Freq: Every day | ORAL | Status: DC
  Administered 2015-02-17 – 2015-02-18 (×2): 1 via ORAL
  Filled 2015-02-16 (×2): qty 1

## 2015-02-16 MED ORDER — MEPERIDINE HCL 25 MG/ML IJ SOLN: 6.2500 mg | INTRAMUSCULAR | Status: DC | PRN

## 2015-02-16 MED ORDER — KETOROLAC TROMETHAMINE 30 MG/ML IJ SOLN: 30.0000 mg | Freq: Four times a day (QID) | INTRAMUSCULAR | Status: AC | PRN

## 2015-02-16 MED ORDER — FAMOTIDINE IN NACL 20-0.9 MG/50ML-% IV SOLN
INTRAVENOUS | Status: AC
  Administered 2015-02-16: 50 mL via INTRAVENOUS
  Filled 2015-02-16: qty 50

## 2015-02-16 MED ORDER — LACTATED RINGERS IV SOLN
INTRAVENOUS | Status: DC
Start: 2015-02-16 — End: 2015-02-18
  Administered 2015-02-16: 23:00:00 via INTRAVENOUS

## 2015-02-16 MED ORDER — MORPHINE SULFATE (PF) 0.5 MG/ML IJ SOLN
INTRAMUSCULAR | Status: AC
  Filled 2015-02-16: qty 100

## 2015-02-16 MED ORDER — LACTATED RINGERS IV SOLN
Freq: Once | INTRAVENOUS | Status: AC
  Administered 2015-02-16: 08:00:00 via INTRAVENOUS

## 2015-02-16 MED ORDER — SENNOSIDES-DOCUSATE SODIUM 8.6-50 MG PO TABS
2.0000 | ORAL_TABLET | ORAL | Status: DC
  Administered 2015-02-17 – 2015-02-18 (×2): 2 via ORAL
  Filled 2015-02-16 (×2): qty 2

## 2015-02-16 MED ORDER — ONDANSETRON HCL 4 MG/2ML IJ SOLN: 4.0000 mg | Freq: Three times a day (TID) | INTRAMUSCULAR | Status: DC | PRN

## 2015-02-16 SURGICAL SUPPLY — 34 items
BENZOIN TINCTURE PRP APPL 2/3 (GAUZE/BANDAGES/DRESSINGS) ×3 IMPLANT
CLAMP CORD UMBIL (MISCELLANEOUS) IMPLANT
CLIP FILSHIE TUBAL LIGA STRL (Clip) ×3 IMPLANT
CLOSURE WOUND 1/2 X4 (GAUZE/BANDAGES/DRESSINGS) ×1
CLOTH BEACON ORANGE TIMEOUT ST (SAFETY) ×3 IMPLANT
DRAPE SHEET LG 3/4 BI-LAMINATE (DRAPES) ×3 IMPLANT
DRSG OPSITE POSTOP 4X10 (GAUZE/BANDAGES/DRESSINGS) ×3 IMPLANT
DURAPREP 26ML APPLICATOR (WOUND CARE) ×3 IMPLANT
ELECT REM PT RETURN 9FT ADLT (ELECTROSURGICAL) ×3
ELECTRODE REM PT RTRN 9FT ADLT (ELECTROSURGICAL) ×1 IMPLANT
EXTRACTOR VACUUM KIWI (MISCELLANEOUS) IMPLANT
GLOVE BIO SURGEON ST LM GN SZ9 (GLOVE) ×3 IMPLANT
GLOVE BIOGEL PI IND STRL 9 (GLOVE) ×1 IMPLANT
GLOVE BIOGEL PI INDICATOR 9 (GLOVE) ×2
GOWN STRL REUS W/TWL 2XL LVL3 (GOWN DISPOSABLE) ×3 IMPLANT
GOWN STRL REUS W/TWL LRG LVL3 (GOWN DISPOSABLE) ×6 IMPLANT
NEEDLE HYPO 25X5/8 SAFETYGLIDE (NEEDLE) IMPLANT
NS IRRIG 1000ML POUR BTL (IV SOLUTION) ×3 IMPLANT
PACK C SECTION WH (CUSTOM PROCEDURE TRAY) ×3 IMPLANT
PAD OB MATERNITY 4.3X12.25 (PERSONAL CARE ITEMS) ×3 IMPLANT
PENCIL SMOKE EVAC W/HOLSTER (ELECTROSURGICAL) ×3 IMPLANT
RTRCTR C-SECT PINK 25CM LRG (MISCELLANEOUS) IMPLANT
RTRCTR C-SECT PINK 34CM XLRG (MISCELLANEOUS) ×3 IMPLANT
STRIP CLOSURE SKIN 1/2X4 (GAUZE/BANDAGES/DRESSINGS) ×2 IMPLANT
SUT MNCRL 0 VIOLET CTX 36 (SUTURE) ×2 IMPLANT
SUT MONOCRYL 0 CTX 36 (SUTURE) ×4
SUT VIC AB 0 CT1 27 (SUTURE) ×2
SUT VIC AB 0 CT1 27XBRD ANBCTR (SUTURE) ×1 IMPLANT
SUT VIC AB 2-0 CT1 27 (SUTURE) ×4
SUT VIC AB 2-0 CT1 TAPERPNT 27 (SUTURE) ×2 IMPLANT
SUT VIC AB 4-0 KS 27 (SUTURE) ×3 IMPLANT
SYR BULB IRRIGATION 50ML (SYRINGE) IMPLANT
TOWEL OR 17X24 6PK STRL BLUE (TOWEL DISPOSABLE) ×3 IMPLANT
TRAY FOLEY CATH SILVER 14FR (SET/KITS/TRAYS/PACK) ×3 IMPLANT

## 2015-02-16 NOTE — OR Nursing (Signed)
Pt was ready to be discharged to her room at 11:45. Waiting for room to be cleaned. Margarita Mail rn

## 2015-02-16 NOTE — Op Note (Signed)
02/16/2015 10:40 AM  PATIENT:  Julie Estes  24 y.o. female  PRE-OPERATIVE DIAGNOSIS:  REPEAT C/S, PREGNANCY 72 WKS with Desired sterilization  POST-OPERATIVE DIAGNOSIS:  REPEAT C/S, PREGNANCY 39 WKS, BILATERAL TUBAL STERILIZATION  PROCEDURE:  Procedure(s): CESAREAN SECTION WITH BILATERAL TUBAL LIGATION (Bilateral)  SURGEON:  Surgeon(s) and Role:    * [name], MD - Primary    * Federico Flake, MD - Assisting  ASSISTANTS: Herma Mering   ANESTHESIA:   spinal  EBL:  Total I/O In: 2600 [I.V.:2600] Out: 1400 [Urine:400; Blood:1000]  INDICATIONS: Julie Estes is a 25 y.o. G2P2002 at [redacted]w[redacted]d here for cesarean section and bilateral tubal sterilization secondary to the indications listed under preoperative diagnoses; please see preoperative note for further details.  The risks of surgery were discussed with the patient including but were not limited to: bleeding which may require transfusion or reoperation; infection which may require antibiotics; injury to bowel, bladder, ureters or other surrounding organs; injury to the fetus; need for additional procedures including hysterectomy in the event of a life-threatening hemorrhage; placental abnormalities wth subsequent pregnancies, incisional problems, thromboembolic phenomenon and other postoperative/anesthesia complications.  Patient also desires permanent sterilization.  Other reversible forms of contraception were discussed with patient; she declines all other modalities. Risks of procedure discussed with patient including but not limited to: risk of regret, permanence of method, bleeding, infection, injury to surrounding organs and need for additional procedures.  Failure risk of 1-2% with increased risk of ectopic gestation if pregnancy occurs was also discussed with patient.  The patient concurred with the proposed plan, giving informed written consent for the procedures.    FINDINGS:  Viable female infant in cephalic  presentation.  Apgars 9 and 9.  Clear amniotic fluid.  Intact placenta, three vessel cord.  Normal uterus, fallopian tubes and ovaries bilaterally. Fallopian tubes sterilized with Filshie clips bilaterally.  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   PROCEDURE IN DETAIL:  The patient preoperatively received intravenous antibiotics and had sequential compression devices applied to her lower extremities.   She was then taken to the operating room where spinal anesthesia was administered and was found to be adequate. She was then placed in a dorsal supine position with a leftward tilt, and prepped and draped in a sterile manner.  A foley catheter was placed into her bladder and attached to constant gravity.  After an adequate timeout was performed, a wide elliptical skin incision was made to resect previous scar and underlying subcutaneous fat was removed.  Pfannenstiel skin incision was carried through to the underlying layer of fascia. The fascia was incised in the midline, and this incision was extended bilaterally using the Mayo scissors.  Kocher clamps were applied to the superior aspect of the fascial incision and the underlying rectus muscles were dissected off bluntly. A similar process was carried out on the inferior aspect of the fascial incision. The rectus muscles were separated in the midline bluntly and the peritoneum was entered bluntly. Attention was turned to the lower uterine segment where a low transverse hysterotomy was made with a scalpel and extended bilaterally bluntly.  Attempts were made to deliver the infant manually and the umbilicial cord was extruding through the hysterotomy and a Kiwi was placed with two pop offs. Next a mushroom vacuum was placed and infant's head successfully delivered with one pull.  Total time to delivery of infant head was less than 3  minutes. The cord  was clamped and cut and the infant was handed over to awaiting neonatology team. Uterine massage was then administered, and the placenta delivered intact with a three-vessel cord. The uterus was then cleared of clot and debris.  The hysterotomy was closed with 0 Monocryl in a running locked fashion, and an imbricating layer was also placed with 0 Vicryl. Figure-of-eight serosal stitches were placed to help with hemostasis.  Attention was then turned to the fallopian tubes, and Filshie clips were placed about 3 cm from the cornua, with care given to incorporate the underlying mesosalpinx on both sides, allowing for bilateral tubal sterilization. The pelvis was cleared of all clot and debris. Hemostasis was confirmed on all surfaces.  The peritoneum and the muscles were reapproximated using 0 Vicryl interrupted stitches. The fascia was then closed using 0 Vicryl in a running fashion.  The subcutaneous layer was irrigated, then reapproximated with 2-0 plain gut interrupted stitches,The skin was closed with a 4-0 Vicryl subcuticular stitch. The patient tolerated the procedure well. Sponge, lap, instrument and needle counts were correct x 2.  She was taken to the recovery room in stable condition.   Federico Flake, MD Family Medicine, OB Fellow [facility]

## 2015-02-16 NOTE — Brief Op Note (Signed)
02/16/2015  10:40 AM  PATIENT:  Julie Estes  24 y.o. female  PRE-OPERATIVE DIAGNOSIS:  REPEAT C/S, PREGNANCY 39 WKS  POST-OPERATIVE DIAGNOSIS:  REPEAT C/S, PREGNANCY 39 WKS  PROCEDURE:  Procedure(s): CESAREAN SECTION WITH BILATERAL TUBAL LIGATION (Bilateral)  SURGEON:  Surgeon(s) and Role:    * [name], MD - Primary    * Federico Flake, MD - Assisting  ASSISTANTS: Herma Mering   ANESTHESIA:   spinal  EBL:  Total I/O In: 2600 [I.V.:2600] Out: 1400 [Urine:400; Blood:1000]  BLOOD ADMINISTERED:none  DRAINS: none   LOCAL MEDICATIONS USED:  NONE  SPECIMEN:  Source of Specimen:  Placenta   DISPOSITION OF SPECIMEN:  PATHOLOGY  COUNTS:  YES  TOURNIQUET:  * No tourniquets in log *  DICTATION: .Note written in EPIC  PLAN OF CARE: Admit to inpatient   PATIENT DISPOSITION:  PACU - hemodynamically stable.   Delay start of Pharmacological VTE agent (>24hrs) due to surgical blood loss or risk of bleeding: yes

## 2015-02-16 NOTE — Transfer of Care (Signed)
Immediate Anesthesia Transfer of Care Note  Patient: Julie Estes  Procedure(s) Performed: Procedure(s): CESAREAN SECTION WITH BILATERAL TUBAL LIGATION (Bilateral)  Patient Location: PACU  Anesthesia Type:Spinal  Level of Consciousness: awake, alert  and oriented  Airway & Oxygen Therapy: Patient Spontanous Breathing  Post-op Assessment: Report given to RN and Post -op Vital signs reviewed and stable  Post vital signs: Reviewed and stable  Last Vitals:  Filed Vitals:   02/16/15 0740  BP: 139/79  Pulse: 101  Temp: 36.7 C  Resp: 20    Complications: No apparent anesthesia complications

## 2015-02-16 NOTE — Anesthesia Preprocedure Evaluation (Signed)
Anesthesia Evaluation  Patient identified by MRN, date of birth, ID band Patient awake    Reviewed: Allergy & Precautions, NPO status , Patient's Chart, lab work & pertinent test results  History of Anesthesia Complications Negative for: history of anesthetic complications  Airway Mallampati: II  TM Distance: >3 FB Neck ROM: Full    Dental  (+) Teeth Intact   Pulmonary neg pulmonary ROS,    breath sounds clear to auscultation       Cardiovascular negative cardio ROS   Rhythm:Regular     Neuro/Psych negative neurological ROS  negative psych ROS   GI/Hepatic negative GI ROS, Neg liver ROS,   Endo/Other  negative endocrine ROS  Renal/GU negative Renal ROS     Musculoskeletal   Abdominal   Peds  Hematology   Anesthesia Other Findings   Reproductive/Obstetrics (+) Pregnancy                             Anesthesia Physical Anesthesia Plan  ASA: II  Anesthesia Plan: Spinal   Post-op Pain Management:    Induction: Intravenous  Airway Management Planned: Nasal Cannula  Additional Equipment: None  Intra-op Plan:   Post-operative Plan:   Informed Consent: I have reviewed the patients History and Physical, chart, labs and discussed the procedure including the risks, benefits and alternatives for the proposed anesthesia with the patient or authorized representative who has indicated his/her understanding and acceptance.   Dental advisory given  Plan Discussed with: CRNA and Surgeon  Anesthesia Plan Comments:         Anesthesia Quick Evaluation

## 2015-02-16 NOTE — Anesthesia Postprocedure Evaluation (Signed)
  Anesthesia Post-op Note  Patient: Julie Estes  Procedure(s) Performed: Procedure(s): CESAREAN SECTION WITH BILATERAL TUBAL LIGATION (Bilateral)  Patient Location: PACU  Anesthesia Type:Spinal  Level of Consciousness: awake  Airway and Oxygen Therapy: Patient Spontanous Breathing  Post-op Pain: mild  Post-op Assessment: Post-op Vital signs reviewed, Patient's Cardiovascular Status Stable, Respiratory Function Stable, Patent Airway, No signs of Nausea or vomiting and Pain level controlled LLE Motor Response: Purposeful movement LLE Sensation: Tingling RLE Motor Response: Purposeful movement RLE Sensation: Tingling      Post-op Vital Signs: Reviewed and stable  Last Vitals:  Filed Vitals:   02/16/15 1300  BP: 97/72  Pulse: 71  Temp: 36.7 C  Resp: 18    Complications: No apparent anesthesia complications

## 2015-02-16 NOTE — OR Nursing (Signed)
T.o. States pt can be d/c to room.

## 2015-02-16 NOTE — H&P (View-Only) (Signed)
Julie Estes is a 23 y.o. female presenting for repeat cesarean section and bilateral tubal ligation. History OB History    Gravida Para Term Preterm AB TAB SAB Ectopic Multiple Living   2 1 1       1     Past Medical History  Diagnosis Date  . Supervision of normal pregnancy in first trimester 08/04/2014  . History of cesarean section 08/04/2014  . Medical history non-contributory    Past Surgical History  Procedure Laterality Date  . Cesarean section  03/28/2010    X1   Family History: family history includes COPD in her maternal grandfather; Cancer in her maternal grandmother; Heart disease in her paternal grandfather; Hypertension in her brother; Lung disease in her father; Other in her mother; Stroke in her paternal grandmother. Social History:  reports that she has never smoked. She has never used smokeless tobacco. She reports that she does not drink alcohol or use illicit drugs.   Prenatal Transfer Tool  Maternal Diabetes: No Genetic Screening: Normal Maternal Ultrasounds/Referrals: Normal Fetal Ultrasounds or other Referrals:  None Maternal Substance Abuse:  No Significant Maternal Medications:  None Significant Maternal Lab Results:  Lab values include: Group B Strep negative Other Comments:  None  Review of Systems  Constitutional: Negative.   Eyes: Negative.   Respiratory: Negative.   Cardiovascular: Negative.       Last menstrual period 05/06/2014. Exam Physical Exam  Constitutional: She is oriented to person, place, and time. She appears well-developed and well-nourished.  HENT:  Head: Normocephalic.  Eyes: Pupils are equal, round, and reactive to light.  Neck: Normal range of motion.  Cardiovascular: Normal rate.   Respiratory: Effort normal.  GI: Soft.  Gravid uterus consistent with dates  Genitourinary: Vagina normal.  Musculoskeletal: Normal range of motion.  Neurological: She is alert and oriented to person, place, and time. She has normal  reflexes.  Psychiatric: She has a normal mood and affect. Her behavior is normal.   LMP 05/06/2014 (Approximate)  Prenatal labs: ABO, Rh: A/--/-- (03/17 1212) Antibody: Negative (07/08 0913) Rubella:   RPR: Non Reactive (07/08 0913)  HBsAg: Negative (03/17 1212)  HIV:    GBS:   negative  Assessment/Plan: Pregnancy 39 weeks prior cesarean section after trial of labor desire for elective permanent sterilization Plan: Repeat cesarean section and bilateral tubal ligation 02/16/2015 9 AM   FERGUSON,JOHN V 02/14/2015, 6:07 PM     

## 2015-02-16 NOTE — Anesthesia Postprocedure Evaluation (Signed)
  Anesthesia Post-op Note  Patient: Julie Estes  Procedure(s) Performed: Procedure(s): CESAREAN SECTION WITH BILATERAL TUBAL LIGATION (Bilateral)  Patient Location: Mother/Baby  Anesthesia Type:Spinal  Level of Consciousness: awake  Airway and Oxygen Therapy: Patient Spontanous Breathing  Post-op Pain: mild  Post-op Assessment: Patient's Cardiovascular Status Stable and Respiratory Function Stable LLE Motor Response: Purposeful movement LLE Sensation: Tingling RLE Motor Response: Purposeful movement RLE Sensation: Tingling      Post-op Vital Signs: stable  Last Vitals:  Filed Vitals:   02/16/15 1300  BP: 97/72  Pulse: 71  Temp: 36.7 C  Resp: 18    Complications: No apparent anesthesia complications

## 2015-02-16 NOTE — Interval H&P Note (Signed)
History and Physical Interval Note:  02/16/2015 8:54 AM  Julie Estes  has presented today for surgery, with the diagnosis of REPEAT C/S, PREGNANCY 39 WKS  The various methods of treatment have been discussed with the patient and family. After consideration of risks, benefits and other options for treatment, the patient has consented to  Procedure(s): CESAREAN SECTION WITH BILATERAL TUBAL LIGATION (Bilateral) as a surgical intervention .  The patient's history has been reviewed, patient examined, no change in status, stable for surgery.  I have reviewed the patient's chart and labs.  Questions were answered to the patient's satisfaction.     [name]

## 2015-02-17 LAB — CBC
HCT: 29.5 % — ABNORMAL LOW (ref 36.0–46.0)
Hemoglobin: 9.4 g/dL — ABNORMAL LOW (ref 12.0–15.0)
MCH: 27.9 pg (ref 26.0–34.0)
MCHC: 31.9 g/dL (ref 30.0–36.0)
MCV: 87.5 fL (ref 78.0–100.0)
PLATELETS: 302 10*3/uL (ref 150–400)
RBC: 3.37 MIL/uL — AB (ref 3.87–5.11)
RDW: 15.1 % (ref 11.5–15.5)
WBC: 14.2 10*3/uL — AB (ref 4.0–10.5)

## 2015-02-17 LAB — BIRTH TISSUE RECOVERY COLLECTION (PLACENTA DONATION)

## 2015-02-17 NOTE — Progress Notes (Signed)
POSTPARTUM PROGRESS NOTE  Post Partum Day 1 Subjective:  Julie Estes is a 24 y.o. [id number] [redacted]w[redacted]d s/p scheduled rltcs w/ btl.  No acute events overnight.  Pt denies problems with ambulating, voiding or po intake.  She denies nausea or vomiting.  Pain is well controlled.  She has had flatus. She has not had bowel movement.  Lochia Minimal.   Objective: Blood pressure 105/53, pulse 62, temperature 97.9 F (36.6 C), temperature source Oral, resp. rate 18, last menstrual period 05/06/2014, SpO2 99 %, unknown if currently breastfeeding.  Physical Exam:  General: alert, cooperative and no distress Lochia:normal flow Chest: CTAB Heart: RRR no m/r/g Abdomen: +BS, soft, nontender, incision c/d/i Uterine Fundus: firm,  DVT Evaluation: No calf swelling or tenderness Extremities: trace edema   Recent Labs  02/14/15 1415 02/17/15 0620  HGB 11.7* 9.4*  HCT 37.2 29.5*    Assessment/Plan:  ASSESSMENT: Julie Estes is a 24 y.o. [id number] [redacted]w[redacted]d s/p scheduled rltcs w/ btl, doing well. H 11.7 to 9.4.   Plan for discharge tomorrow   LOS: 1 day   Silvano Bilis 02/17/2015, 8:06 AM

## 2015-02-18 ENCOUNTER — Encounter (HOSPITAL_COMMUNITY): Payer: Self-pay | Admitting: Obstetrics and Gynecology

## 2015-02-18 MED ORDER — OXYCODONE-ACETAMINOPHEN 5-325 MG PO TABS: 1.0000 | ORAL_TABLET | ORAL | Status: DC | PRN

## 2015-02-18 MED ORDER — IBUPROFEN 600 MG PO TABS: 600.0000 mg | ORAL_TABLET | Freq: Four times a day (QID) | ORAL | Status: DC | PRN

## 2015-02-18 NOTE — Discharge Summary (Signed)
OB Discharge Summary  [name] DOB: [birth year] MRN: [id number]  Date of admission: 02/16/2015 Delivering MD: [name]   Date of discharge: 02/18/2015  Admitting diagnosis: Repeat c/s & BTL, pregnancy 39wks Intrauterine pregnancy: [redacted]w[redacted]d     Secondary diagnosis: None     Discharge diagnosis: Term Pregnancy Delivered                                                                                                Post partum procedures:none  Augmentation: n/a  Complications: None  Hospital course:  Sceduled C/S   24 y.o. yo G2P2002 at [redacted]w[redacted]d was admitted to the hospital 02/16/2015 for scheduled cesarean section & BTL with the following indication:Elective Repeat.  Membrane Rupture Time/Date: 9:47 AM ,02/16/2015   Patient delivered a Viable infant.02/16/2015  Details of operation can be found in separate operative note.  Pateint had an uncomplicated postpartum course.  She is ambulating, tolerating a regular diet, passing flatus, and urinating well. Patient is discharged home in stable condition on No discharge date for patient encounter.          Physical exam  Filed Vitals:   02/17/15 0430 02/17/15 0808 02/17/15 1954 02/18/15 0611  BP: 105/53  120/71 106/59  Pulse: 62 77 89 71  Temp: 97.9 F (36.6 C)  97.7 F (36.5 C) 98 F (36.7 C)  TempSrc: Oral Oral Oral Oral  Resp: SpO2: 99% 94% 100%    General: alert, cooperative and no distress Lochia: appropriate Uterine Fundus: firm Incision: Healing well with no significant drainage, No significant erythema, Dressing is clean, dry, and intact DVT Evaluation: No evidence of DVT seen on physical exam. Negative Homan's sign. No cords or calf tenderness. No significant calf/ankle edema. Labs: Lab Results  Component Value Date   WBC 14.2* 02/17/2015   HGB 9.4* 02/17/2015   HCT 29.5* 02/17/2015   MCV 87.5 02/17/2015   PLT 302 02/17/2015   CMP Latest Ref Rng 02/16/2015  Total Protein  6.5 - 8.1 g/dL 7.0  Total Bilirubin 0.3 - 1.2 mg/dL 0.6  Alkaline Phos 38 - 126 U/L 141(H)  AST 15 - 41 U/L 16  ALT 14 - 54 U/L 12(L)    Discharge instruction: per After Visit Summary and "Baby and Me Booklet".  Medications:  Current facility-administered medications:  .  acetaminophen (TYLENOL) tablet 650 mg, 650 mg, Oral, Q4H PRN, Federico Flake, MD .  witch hazel-glycerin (TUCKS) pad 1 application, 1 application, Topical, PRN **AND** dibucaine (NUPERCAINAL) 1 % rectal ointment 1 application, 1 application, Rectal, PRN, Federico Flake, MD .  diphenhydrAMINE (BENADRYL) injection 12.5 mg, 12.5 mg, Intravenous, Q4H PRN **OR** diphenhydrAMINE (BENADRYL) capsule 25 mg, 25 mg, Oral, Q4H PRN, Val Eagle, MD, 25 mg at 02/17/15 0048 .  diphenhydrAMINE (BENADRYL) capsule 25 mg, 25 mg, Oral, Q6H PRN, Federico Flake, MD .  ibuprofen (ADVIL,MOTRIN) tablet 600 mg, 600 mg, Oral, 4 times per day, Federico Flake, MD, 600 mg at 02/18/15 1300 .  Influenza vac split quadrivalent PF (FLUARIX) injection 0.5 mL,  0.5 mL, Intramuscular, Tomorrow-1000, [name], MD .  lactated ringers infusion, , Intravenous, Continuous, Federico Flake, MD, Last Rate: 125 mL/hr at 02/16/15 2324 .  lanolin ointment 1 application, 1 application, Topical, PRN, Federico Flake, MD .  menthol-cetylpyridinium (CEPACOL) lozenge 3 mg, 1 lozenge, Oral, Q2H PRN, Federico Flake, MD .  nalbuphine (NUBAIN) injection 5 mg, 5 mg, Intravenous, Q4H PRN **OR** nalbuphine (NUBAIN) injection 5 mg, 5 mg, Subcutaneous, Q4H PRN, Val Eagle, MD, 5 mg at 02/16/15 1113 .  nalbuphine (NUBAIN) injection 5 mg, 5 mg, Intravenous, Once PRN **OR** nalbuphine (NUBAIN) injection 5 mg, 5 mg, Subcutaneous, Once PRN, Val Eagle, MD .  naloxone [facility]) 2 mg in dextrose 5 % 250 mL infusion, 1-4 mcg/kg/hr, Intravenous, Continuous PRN, Val Eagle, MD .  naloxone [facility]) injection 0.4 mg,  0.4 mg, Intravenous, PRN **AND** sodium chloride 0.9 % injection 3 mL, 3 mL, Intravenous, PRN, Val Eagle, MD .  ondansetron [facility]) injection 4 mg, 4 mg, Intravenous, Q8H PRN, Val Eagle, MD .  oxyCODONE-acetaminophen (PERCOCET/ROXICET) 5-325 MG per tablet 1 tablet, 1 tablet, Oral, Q4H PRN, Federico Flake, MD, 1 tablet at 02/17/15 1155 .  oxyCODONE-acetaminophen (PERCOCET/ROXICET) 5-325 MG per tablet 2 tablet, 2 tablet, Oral, Q4H PRN, Federico Flake, MD, 2 tablet at 02/18/15 1304 .  prenatal multivitamin tablet 1 tablet, 1 tablet, Oral, Q1200, Federico Flake, MD, 1 tablet at 02/18/15 1300 .  scopolamine (TRANSDERM-SCOP) 1 MG/3DAYS 1.5 mg, 1 patch, Transdermal, Once, Val Eagle, MD .  senna-docusate (Senokot-S) tablet 2 tablet, 2 tablet, Oral, Q24H, Federico Flake, MD, 2 tablet at 02/18/15 0001 .  simethicone (MYLICON) chewable tablet 80 mg, 80 mg, Oral, TID PC, Federico Flake, MD, 80 mg at 02/18/15 1300 .  simethicone (MYLICON) chewable tablet 80 mg, 80 mg, Oral, Q24H, Federico Flake, MD, 80 mg at 02/18/15 1300 .  simethicone (MYLICON) chewable tablet 80 mg, 80 mg, Oral, PRN, Federico Flake, MD, 80 mg at 02/18/15 0001 .  Tdap (BOOSTRIX) injection 0.5 mL, 0.5 mL, Intramuscular, Once, Federico Flake, MD .  zolpidem [facility]) tablet 5 mg, 5 mg, Oral, QHS PRN, Federico Flake, MD  Diet: routine diet  Activity: Advance as tolerated. Pelvic rest for 6 weeks.   Outpatient follow up:1 wk for c/s incision check @ [facility], then 4-6wks for pp visit  Postpartum contraception: Tubal Ligation  Newborn Data: Live born female  Birth Weight: 8 lb 7.4 oz (3839 g) APGAR: 9, 9  Baby Feeding: Bottle Disposition:home with mother   02/18/2015 Marge Duncans, CNM

## 2015-02-18 NOTE — Progress Notes (Signed)
Subjective: Postpartum Day 2: Cesarean Delivery & BTL Eating, drinking, voiding, ambulating well.  +flatus.  Lochia and pain wnl.  Denies dizziness, lightheadedness, or sob. No complaints. Interested in d/c today if baby able to go home, but was told baby's bili level was high, hasn't heard from md yet.   Objective: Vital signs in last 24 hours: Temp:  [97.7 F (36.5 C)-98 F (36.7 C)] 98 F (36.7 C) (10/01 4098) Pulse Rate:  [71-89] 71 (10/01 0611) Resp:  [18-20] 18 (10/01 0611) BP: (106-120)/(59-71) 106/59 mmHg (10/01 0611) SpO2:  [100 %] 100 % (09/30 1954)  Physical Exam:  General: alert, cooperative and no distress Lochia: appropriate Uterine Fundus: firm Incision: healing well, no significant drainage, no dehiscence, no significant erythema DVT Evaluation: No evidence of DVT seen on physical exam. Negative Homan's sign. No cords or calf tenderness. No significant calf/ankle edema.   Recent Labs  02/17/15 0620  HGB 9.4*  HCT 29.5*    Assessment/Plan: Status post Cesarean section & BTL. Doing well postoperatively.  Continue current care. Possible d/c later if baby able to go home Bottlefeeding  Marge Duncans 02/18/2015, 9:45 AM

## 2015-02-18 NOTE — Discharge Instructions (Signed)

## 2015-02-21 DIAGNOSIS — Z3490 Encounter for supervision of normal pregnancy, unspecified, unspecified trimester: Secondary | ICD-10-CM | POA: Insufficient documentation

## 2015-02-24 ENCOUNTER — Ambulatory Visit (INDEPENDENT_AMBULATORY_CARE_PROVIDER_SITE_OTHER): Payer: Medicaid Other | Admitting: Obstetrics and Gynecology

## 2015-02-24 ENCOUNTER — Encounter: Payer: Medicaid Other | Admitting: Obstetrics & Gynecology

## 2015-02-24 ENCOUNTER — Encounter: Payer: Self-pay | Admitting: Obstetrics and Gynecology

## 2015-02-24 VITALS — BP 130/90 | HR 72 | Ht 64.0 in | Wt 242.2 lb

## 2015-02-24 DIAGNOSIS — O1205 Gestational edema, complicating the puerperium: Secondary | ICD-10-CM

## 2015-02-24 DIAGNOSIS — O165 Unspecified maternal hypertension, complicating the puerperium: Secondary | ICD-10-CM

## 2015-02-24 MED ORDER — HYDROCHLOROTHIAZIDE 25 MG PO TABS: 25.0000 mg | ORAL_TABLET | Freq: Every day | ORAL | Status: DC

## 2015-02-24 MED ORDER — OXYCODONE-ACETAMINOPHEN 5-325 MG PO TABS: 1.0000 | ORAL_TABLET | ORAL | Status: DC | PRN

## 2015-02-24 MED ORDER — IBUPROFEN 600 MG PO TABS: 600.0000 mg | ORAL_TABLET | Freq: Four times a day (QID) | ORAL | Status: DC | PRN

## 2015-02-24 NOTE — Progress Notes (Signed)
Patient ID: Julie Estes, female   DOB: Apr 14, 1991, 24 y.o.   MRN: [id number]  Subjective:  Julie Estes is a 24 y.o. female now 2 weeks status post c-section with bilateral tubal ligation  Review of Systems Negative except pain at incision site   Diet:   normal   Bowel movements : normal.  Pain is controlled with current analgesics. Medications being used: Hydrocodone and Ibuprofen.  Objective:  BP 130/90 mmHg  Pulse 72  Ht  (1.626 m)  Wt 242 lb 3.2 oz (109.861 kg)  BMI 41.55 kg/m2  LMP 05/06/2014 (Approximate)  Breastfeeding? No General:Well developed, well nourished.  No acute distress. Abdomen: Bowel sounds normal, soft, non-tender. Pelvic Exam: not done  Extremities: 4+ bilateral pitting edema of lower extremities up to knee. No calf pain Neuro: Negative abdominojugular reflex  Incision(s):   Healing well no drainage, no erythema, no hernia, no swelling, no dehiscence,     Assessment:  Post-Op 2 weeks s/p c-section with bilateral tubal ligation  Doing well postoperatively. Would like refill on hydrocodone medication for pain mgmt   Plan:  1.Wound care discussed   2. . current medication unchanged 3. Activity restrictions: unchanged 4. return to work: N/A 5. Follow up in 1 week for BP check   By signing my name below, I, Jarvis Morgan, attest that this documentation has been prepared under the direction and in the presence of Christin Bach, MD Electronically Signed: Jarvis Morgan, ED Scribe. 02/24/2015. 12:09 PM.  I personally performed the services described in this documentation, which was SCRIBED in my presence. The recorded information has been reviewed and considered accurate. It has been edited as necessary during review. [name], MD

## 2015-03-03 ENCOUNTER — Ambulatory Visit (INDEPENDENT_AMBULATORY_CARE_PROVIDER_SITE_OTHER): Payer: Medicaid Other | Admitting: Obstetrics and Gynecology

## 2015-03-03 VITALS — BP 120/70 | Ht 65.0 in | Wt 225.0 lb

## 2015-03-03 DIAGNOSIS — O165 Unspecified maternal hypertension, complicating the puerperium: Secondary | ICD-10-CM

## 2015-03-03 NOTE — Progress Notes (Signed)
   [facility] Visit  Patient name: Julie ShortsSamantha Estes MRN [id number]  Date of birth: 03/16/1991  CC & HPI:  Julie ShortsSamantha Estes is a 24 y.o. female presenting today for post partum hypertension, s/p c-section 2 weeks ago. She denies any other problems or concerns at this time. She was put on HCTZ 25 mg at the last visit which she feels is providing relief. Pt BP at visit was 120/70. She is down 17lbs from 242 to 225. She denies any HAs or blurred vision.   ROS:  10 Systems reviewed and all are negative for acute change except as noted in the HPI.  Pertinent History Reviewed:   Reviewed: Significant for post partum hypertension Medical         Past Medical History  Diagnosis Date  . Supervision of normal pregnancy in first trimester 08/04/2014  . History of cesarean section 08/04/2014  . Medical history non-contributory                               Surgical Hx:    Past Surgical History  Procedure Laterality Date  . Cesarean section  03/28/2010    X1  . Cesarean section with bilateral tubal ligation Bilateral 02/16/2015    Procedure: CESAREAN SECTION WITH BILATERAL TUBAL LIGATION;  Surgeon: Tilda BurrowJohn Adonai Selsor V, MD;  Location: [facility];  Service: Obstetrics;  Laterality: Bilateral;   Medications: Reviewed & Updated - see associated section                       Current outpatient prescriptions:  .  hydrochlorothiazide (HYDRODIURIL) 25 MG tablet, Take 1 tablet (25 mg total) by mouth daily., Disp: 30 tablet, Rfl: 0 .  ibuprofen (ADVIL,MOTRIN) 600 MG tablet, Take 1 tablet (600 mg total) by mouth every 6 (six) hours as needed for mild pain, moderate pain or cramping., Disp: 30 tablet, Rfl: 0   Social History: Reviewed -  reports that she has never smoked. She has never used smokeless tobacco.  Objective Findings:  Vitals: Blood pressure 120/70, height 5\' 5"  (1.651 m), weight 225 lb (102.059 kg), last menstrual period 05/06/2014, not currently breastfeeding.  Physical Examination: not  done; discussion only   Assessment & Plan:   A:  1. Postpartum hypertension; s/p c-section 2 weeks ago, resolved  P:  1. D/c hctz medications; will f/u in 2 weeks for post partum check  By signing my name below, I, Jarvis Morganaylor Carleah Yablonski, attest that this documentation has been prepared under the direction and in the presence of Tilda BurrowJohn Ryelynn Guedea V, MD. Electronically Signed: Jarvis Morganaylor Griffen Frayne, ED Scribe. 03/03/2015. 11:33 AM.  I personally performed the services described in this documentation, which was SCRIBED in my presence. The recorded information has been reviewed and considered accurate. It has been edited as necessary during review. Tilda BurrowFERGUSON,Daphnee Preiss V, MD

## 2015-03-03 NOTE — Progress Notes (Signed)
Patient ID: Scarlette ShortsSamantha Estes, female   DOB: July 14, 1990, 24 y.o.   MRN: [id number] Pt here today for BP check. Pt denies any problems or concerns at this time.

## 2015-03-17 ENCOUNTER — Ambulatory Visit (INDEPENDENT_AMBULATORY_CARE_PROVIDER_SITE_OTHER): Payer: Medicaid Other | Admitting: Obstetrics and Gynecology

## 2015-03-17 ENCOUNTER — Encounter: Payer: Self-pay | Admitting: Obstetrics and Gynecology

## 2015-03-17 NOTE — Progress Notes (Signed)
Patient ID: Scarlette ShortsSamantha Estes, female   DOB: 12-14-1990, 24 y.o.   MRN: [id number] Pt her today for post partum visit. Pt states that she is still having a lot of pain from her epidural. Pt states that the pain has gotten worse and not better.

## 2015-03-17 NOTE — Progress Notes (Signed)
   [facility] Visit  [name] MRN [id number]  Date of birth: 20-Feb-1991  CC & HPI:  Julie Estes is a 24 y.o. female S/P Cesarean section 4 weeks ago presenting for a post-partum visit. Patient complains of daily lower lumbar pain in the site where she received an epidural. She states her pain is worse with certain positions, including lying down.  ROS:  A complete system review of systems was obtained and all systems are negative except as noted in the HPI and PMH.    Pertinent History Reviewed:   Reviewed: Significant for Cesarean section with BTL Medical         Past Medical History  Diagnosis Date  . Supervision of normal pregnancy in first trimester 08/04/2014  . History of cesarean section 08/04/2014  . Medical history non-contributory                               Surgical Hx:    Past Surgical History  Procedure Laterality Date  . Cesarean section  03/28/2010    X1  . Cesarean section with bilateral tubal ligation Bilateral 02/16/2015    Procedure: CESAREAN SECTION WITH BILATERAL TUBAL LIGATION;  Surgeon: Tilda BurrowJohn Korryn Pancoast V, MD;  Location: [facility];  Service: Obstetrics;  Laterality: Bilateral;   Medications: Reviewed & Updated - see associated section                      No current outpatient prescriptions on file.   Social History: Reviewed -  reports that she has never smoked. She has never used smokeless tobacco.  Objective Findings:  Vitals: Blood pressure 110/66, height 5\' 5"  (1.651 m), weight 221 lb (100.245 kg), last menstrual period 05/06/2014, not currently breastfeeding.  Physical Examination: General appearance - alert, well appearing, and in no distress, oriented to person, place, and time and overweight Mental status - alert, oriented to person, place, and time, normal mood, behavior, speech, dress, motor activity, and thought processes, affect appropriate to mood Abdomen - soft, nontender, nondistended, no masses or organomegaly;  well-healed incisional scar, but sensitive at the left tip Pelvic - normal external genitalia, vulva, vagina, cervix, uterus and adnexa,  VULVA: normal appearing vulva with no masses, tenderness or lesions,  VAGINA: normal appearing vagina with normal color and discharge, no lesions  CERVIX: normal appearing cervix without discharge or lesions,  UTERUS: uterus is normal size, shape, consistency and nontender,  ADNEXA: normal adnexa in size, nontender and no masses.  Back - TTP inferior to site of epidural injection   Assessment & Plan:   A:  1. Post-spinal discomfort in area of epidural.  2. Otherwise normal post-partum visit.   P:  1. NSAIDs for back discomfort. F/u in 3 months if pain unresolved.  2 routine gyn 1 yr.    By signing my name below, I, Ronney LionSuzanne Le, attest that this documentation has been prepared under the direction and in the presence of Tilda BurrowJohn Shekia Kuper V, MD. Electronically Signed: Ronney LionSuzanne Le, ED Scribe. 03/17/2015. 11:07 AM.  I personally performed the services described in this documentation, which was SCRIBED in my presence. The recorded information has been reviewed and considered accurate. It has been edited as necessary during review. Tilda BurrowFERGUSON,Makyle Eslick V, MD  (scribe attestation statement)

## 2015-04-05 ENCOUNTER — Emergency Department (HOSPITAL_COMMUNITY)
Admission: EM | Admit: 2015-04-05 | Discharge: 2015-04-05 | Disposition: A | Discharge status: Home / Self Care | Payer: Medicaid Other | Attending: Emergency Medicine | Admitting: Emergency Medicine

## 2015-04-05 ENCOUNTER — Encounter (HOSPITAL_COMMUNITY): Payer: Self-pay | Admitting: Emergency Medicine

## 2015-04-05 DIAGNOSIS — L0291 Cutaneous abscess, unspecified: Secondary | ICD-10-CM

## 2015-04-05 DIAGNOSIS — L03114 Cellulitis of left upper limb: Secondary | ICD-10-CM | POA: Insufficient documentation

## 2015-04-05 DIAGNOSIS — L02414 Cutaneous abscess of left upper limb: Secondary | ICD-10-CM | POA: Insufficient documentation

## 2015-04-05 MED ORDER — HYDROCODONE-ACETAMINOPHEN 5-325 MG PO TABS
1.0000 | ORAL_TABLET | Freq: Once | ORAL | Status: AC
  Administered 2015-04-05: 1 via ORAL
  Filled 2015-04-05: qty 1

## 2015-04-05 MED ORDER — LIDOCAINE-EPINEPHRINE (PF) 2 %-1:200000 IJ SOLN
10.0000 mL | Freq: Once | INTRAMUSCULAR | Status: AC
  Administered 2015-04-05: 10 mL
  Filled 2015-04-05: qty 20

## 2015-04-05 MED ORDER — LIDOCAINE-EPINEPHRINE (PF) 2 %-1:200000 IJ SOLN
INTRAMUSCULAR | Status: AC
  Filled 2015-04-05: qty 20

## 2015-04-05 MED ORDER — DOXYCYCLINE HYCLATE 100 MG PO CAPS: 100.0000 mg | ORAL_CAPSULE | Freq: Two times a day (BID) | ORAL | Status: DC

## 2015-04-05 MED ORDER — DOXYCYCLINE HYCLATE 100 MG PO TABS
100.0000 mg | ORAL_TABLET | Freq: Once | ORAL | Status: AC
  Administered 2015-04-05: 100 mg via ORAL
  Filled 2015-04-05: qty 1

## 2015-04-05 MED ORDER — HYDROCODONE-ACETAMINOPHEN 5-325 MG PO TABS: 2.0000 | ORAL_TABLET | ORAL | Status: DC | PRN

## 2015-04-05 NOTE — Discharge Instructions (Signed)
Soak and gently massage towards the wound 3-4 times per day.  Abscess An abscess is an infected area that contains a collection of pus and debris.It can occur in almost any part of the body. An abscess is also known as a furuncle or boil. CAUSES  An abscess occurs when tissue gets infected. This can occur from blockage of oil or sweat glands, infection of hair follicles, or a minor injury to the skin. As the body tries to fight the infection, pus collects in the area and creates pressure under the skin. This pressure causes pain. People with weakened immune systems have difficulty fighting infections and get certain abscesses more often.  SYMPTOMS Usually an abscess develops on the skin and becomes a painful mass that is red, warm, and tender. If the abscess forms under the skin, you may feel a moveable soft area under the skin. Some abscesses break open (rupture) on their own, but most will continue to get worse without care. The infection can spread deeper into the body and eventually into the bloodstream, causing you to feel ill.  DIAGNOSIS  Your caregiver will take your medical history and perform a physical exam. A sample of fluid may also be taken from the abscess to determine what is causing your infection. TREATMENT  Your caregiver may prescribe antibiotic medicines to fight the infection. However, taking antibiotics alone usually does not cure an abscess. Your caregiver may need to make a small cut (incision) in the abscess to drain the pus. In some cases, gauze is packed into the abscess to reduce pain and to continue draining the area. HOME CARE INSTRUCTIONS   Only take over-the-counter or prescription medicines for pain, discomfort, or fever as directed by your caregiver.  If you were prescribed antibiotics, take them as directed. Finish them even if you start to feel better.  If gauze is used, follow your caregiver's directions for changing the gauze.  To avoid spreading the  infection:  Keep your draining abscess covered with a bandage.  Wash your hands well.  Do not share personal care items, towels, or whirlpools with others.  Avoid skin contact with others.  Keep your skin and clothes clean around the abscess.  Keep all follow-up appointments as directed by your caregiver. SEEK MEDICAL CARE IF:   You have increased pain, swelling, redness, fluid drainage, or bleeding.  You have muscle aches, chills, or a general ill feeling.  You have a fever. MAKE SURE YOU:   Understand these instructions.  Will watch your condition.  Will get help right away if you are not doing well or get worse.   This information is not intended to replace advice given to you by your health care provider. Make sure you discuss any questions you have with your health care provider.   Document Released: 02/13/2005 Document Revised: 11/05/2011 Document Reviewed: 07/19/2011 Elsevier Interactive Patient Education 2016 Elsevier Inc.  Cellulitis Cellulitis is an infection of the skin and the tissue beneath it. The infected area is usually red and tender. Cellulitis occurs most often in the arms and lower legs.  CAUSES  Cellulitis is caused by bacteria that enter the skin through cracks or cuts in the skin. The most common types of bacteria that cause cellulitis are staphylococci and streptococci. SIGNS AND SYMPTOMS   Redness and warmth.  Swelling.  Tenderness or pain.  Fever. DIAGNOSIS  Your health care provider can usually determine what is wrong based on a physical exam. Blood tests may also be done.  TREATMENT  Treatment usually involves taking an antibiotic medicine. HOME CARE INSTRUCTIONS   Take your antibiotic medicine as directed by your health care provider. Finish the antibiotic even if you start to feel better.  Keep the infected arm or leg elevated to reduce swelling.  Apply a warm cloth to the affected area up to 4 times per day to relieve  pain.  Take medicines only as directed by your health care provider.  Keep all follow-up visits as directed by your health care provider. SEEK MEDICAL CARE IF:   You notice red streaks coming from the infected area.  Your red area gets larger or turns dark in color.  Your bone or joint underneath the infected area becomes painful after the skin has healed.  Your infection returns in the same area or another area.  You notice a swollen bump in the infected area.  You develop new symptoms.  You have a fever. SEEK IMMEDIATE MEDICAL CARE IF:   You feel very sleepy.  You develop vomiting or diarrhea.  You have a general ill feeling (malaise) with muscle aches and pains.   This information is not intended to replace advice given to you by your health care provider. Make sure you discuss any questions you have with your health care provider.   Document Released: 02/13/2005 Document Revised: 01/25/2015 Document Reviewed: 07/22/2011 Elsevier Interactive Patient Education [employer] Elsevier Inc.

## 2015-04-05 NOTE — ED Notes (Signed)
Abscess to left forearm for one week, rates pain 9/10.

## 2015-04-05 NOTE — ED Provider Notes (Signed)
CSN: [id number]     Arrival date & time 04/05/15  1645 History   First MD Initiated Contact with Patient 04/05/15 1658     Chief Complaint  Patient presents with  . Abscess    left forearm      HPI    Past Medical History  Diagnosis Date  . Supervision of normal pregnancy in first trimester 08/04/2014  . History of cesarean section 08/04/2014  . Medical history non-contributory    Past Surgical History  Procedure Laterality Date  . Cesarean section  03/28/2010    X1  . Cesarean section with bilateral tubal ligation Bilateral 02/16/2015    Procedure: CESAREAN SECTION WITH BILATERAL TUBAL LIGATION;  Surgeon: [name], MD;  Location: [facility];  Service: Obstetrics;  Laterality: Bilateral;   Family History  Problem Relation Age of Onset  . Lung disease Father   . Other Mother     herpes  . Hypertension Brother   . Cancer Maternal Grandmother     ovarian  . COPD Maternal Grandfather   . Stroke Paternal Grandmother   . Heart disease Paternal Grandfather    Social History  Substance Use Topics  . Smoking status: Never Smoker   . Smokeless tobacco: Never Used  . Alcohol Use: No   OB History    Gravida Para Term Preterm AB TAB SAB Ectopic Multiple Living   0 2     Review of Systems  Constitutional: Negative for fever, chills, diaphoresis, appetite change and fatigue.  HENT: Negative for mouth sores, sore throat and trouble swallowing.   Eyes: Negative for visual disturbance.  Respiratory: Negative for cough, chest tightness, shortness of breath and wheezing.   Cardiovascular: Negative for chest pain.  Gastrointestinal: Negative for nausea, vomiting, abdominal pain, diarrhea and abdominal distention.  Endocrine: Negative for polydipsia, polyphagia and polyuria.  Genitourinary: Negative for dysuria, frequency and hematuria.  Musculoskeletal: Negative for gait problem.  Skin: Negative for color change, pallor and rash.       Lt FA abscess  Neurological:  Negative for dizziness, syncope, light-headedness and headaches.  Hematological: Does not bruise/bleed easily.  Psychiatric/Behavioral: Negative for behavioral problems and confusion.      Allergies  Review of patient's allergies indicates no known allergies.  Home Medications   Prior to Admission medications   Medication Sig Start Date End Date Taking? Authorizing Provider  doxycycline (VIBRAMYCIN) 100 MG capsule Take 1 capsule (100 mg total) by mouth 2 (two) times daily. 04/05/15   Rolland Porter, MD  HYDROcodone-acetaminophen (NORCO/VICODIN) 5-325 MG tablet Take 2 tablets by mouth every 4 (four) hours as needed. 04/05/15   Rolland Porter, MD   BP 121/92 mmHg  Pulse 78  Temp(Src) 97.8 F (36.6 C) (Oral)  Resp 16  Ht  (1.651 m)  Wt 224 lb (101.606 kg)  BMI 37.28 kg/m2  SpO2 97%  Breastfeeding? No Physical Exam  Constitutional: She is oriented to person, place, and time. She appears well-developed and well-nourished. No distress.  HENT:  Head: Normocephalic.  Eyes: Conjunctivae are normal. Pupils are equal, round, and reactive to light. No scleral icterus.  Neck: Normal range of motion. Neck supple. No thyromegaly present.  Cardiovascular: Normal rate and regular rhythm.  Exam reveals no gallop and no friction rub.   No murmur heard. Pulmonary/Chest: Effort normal and breath sounds normal. No respiratory distress. She has no wheezes. She has no rales.  Abdominal: Soft. Bowel sounds are normal. She  exhibits no distension. There is no tenderness. There is no rebound.  Musculoskeletal: Normal range of motion.  Neurological: She is alert and oriented to person, place, and time.  Skin: Skin is warm and dry. No rash noted.  2 similar area of fluctuance and dark erythematous skin with a central area of drainage sinus periods surrounding 10 cm diameter circular area of erythema/cellulitis, left extensor forearm.  Psychiatric: She has a normal mood and affect. Her behavior is normal.     ED Course  Procedures (including critical care time) Labs Review Labs Reviewed - No data to display  Imaging Review No results found. I have personally reviewed and evaluated these images and lab results as part of my medical decision-making.   EKG Interpretation None      MDM   Final diagnoses:  Abscess  Cellulitis of left upper extremity    INCISION AND DRAINAGE Performed by: Claudean KindsJAMES, Marjon Doxtater JOSEPH Consent: Verbal consent obtained. Risks and benefits: risks, benefits and alternatives were discussed Type: abscess  Body area: Left forearm  Anesthesia: local infiltration  Incision was made with a scalpel.  Local anesthetic: lidocaine 2% c epinephrine  Anesthetic total: 4 ml  Complexity: complex Blunt dissection to break up loculations  Drainage: purulent  Drainage amount: slight, purulent  Packing material: 1/4 in iodoform gauze  Patient tolerance: Patient tolerated the procedure well with no immediate complications.       Rolland PorterMark Preston Garabedian, MD 04/05/15 [phone]

## 2015-05-22 ENCOUNTER — Emergency Department (HOSPITAL_COMMUNITY)
Admission: EM | Admit: 2015-05-22 | Discharge: 2015-05-22 | Disposition: A | Discharge status: Home / Self Care | Payer: Medicaid Other | Attending: Emergency Medicine | Admitting: Emergency Medicine

## 2015-05-22 ENCOUNTER — Emergency Department (HOSPITAL_COMMUNITY): Payer: Medicaid Other

## 2015-05-22 ENCOUNTER — Encounter (HOSPITAL_COMMUNITY): Payer: Self-pay | Admitting: *Deleted

## 2015-05-22 DIAGNOSIS — Z3202 Encounter for pregnancy test, result negative: Secondary | ICD-10-CM | POA: Insufficient documentation

## 2015-05-22 DIAGNOSIS — M5440 Lumbago with sciatica, unspecified side: Secondary | ICD-10-CM | POA: Insufficient documentation

## 2015-05-22 DIAGNOSIS — R202 Paresthesia of skin: Secondary | ICD-10-CM | POA: Insufficient documentation

## 2015-05-22 DIAGNOSIS — R519 Headache, unspecified: Secondary | ICD-10-CM

## 2015-05-22 DIAGNOSIS — Z792 Long term (current) use of antibiotics: Secondary | ICD-10-CM | POA: Diagnosis not present

## 2015-05-22 DIAGNOSIS — R110 Nausea: Secondary | ICD-10-CM | POA: Insufficient documentation

## 2015-05-22 DIAGNOSIS — R51 Headache: Secondary | ICD-10-CM | POA: Diagnosis not present

## 2015-05-22 DIAGNOSIS — R200 Anesthesia of skin: Secondary | ICD-10-CM | POA: Insufficient documentation

## 2015-05-22 DIAGNOSIS — H53149 Visual discomfort, unspecified: Secondary | ICD-10-CM | POA: Diagnosis not present

## 2015-05-22 DIAGNOSIS — M545 Low back pain: Secondary | ICD-10-CM | POA: Diagnosis present

## 2015-05-22 LAB — URINALYSIS, ROUTINE W REFLEX MICROSCOPIC
BILIRUBIN URINE: NEGATIVE
Glucose, UA: NEGATIVE mg/dL
HGB URINE DIPSTICK: NEGATIVE
Ketones, ur: NEGATIVE mg/dL
Leukocytes, UA: NEGATIVE
NITRITE: NEGATIVE
PROTEIN: NEGATIVE mg/dL
SPECIFIC GRAVITY, URINE: 1.015 (ref 1.005–1.030)
pH: 5.5 (ref 5.0–8.0)

## 2015-05-22 LAB — PREGNANCY, URINE: PREG TEST UR: NEGATIVE

## 2015-05-22 MED ORDER — CYCLOBENZAPRINE HCL 10 MG PO TABS: 10.0000 mg | ORAL_TABLET | Freq: Three times a day (TID) | ORAL | Status: DC | PRN

## 2015-05-22 MED ORDER — OXYCODONE-ACETAMINOPHEN 5-325 MG PO TABS: 1.0000 | ORAL_TABLET | ORAL | Status: DC | PRN

## 2015-05-22 MED ORDER — OXYCODONE-ACETAMINOPHEN 5-325 MG PO TABS
1.0000 | ORAL_TABLET | Freq: Once | ORAL | Status: AC
  Administered 2015-05-22: 1 via ORAL
  Filled 2015-05-22: qty 1

## 2015-05-22 NOTE — Discharge Instructions (Signed)

## 2015-05-22 NOTE — ED Provider Notes (Signed)
CSN: [id number]     Arrival date & time 05/22/15  1332 History  By signing my name below, I, Terrance Branch, attest that this documentation has been prepared under the direction and in the presence of Shayonna Ocampo, PA-C. Electronically Signed: Evon Slack, ED Scribe. 05/22/2015. 5:00 PM.   Chief Complaint  Patient presents with  . Back Pain   The history is provided by the patient. No language interpreter was used.   HPI Comments: Julie Estes is a 25 y.o. female who presents to the Emergency Department complaining of low back pain onset 2 months prior. Pt states that she has intermittent, associated leg numbness and tingling. She states that the pain radiates down to her buttocks. She also reports having throbbing HA with associated photophobia and sound sensitivity. Pt states that she has had pain since having epidural during the birth of her daughter. Pt states that she has tried ibuprofen with no relief. Denies bowel/bladder incontinence or other related symptoms. She denies fever, chills, abd pain, and numbness or weakness of the LE's.  Pt reports having a c-section with no complications. She has not followed up wither her OB/GYN stating that she was advised to wait three months after her epidural.  Past Medical History  Diagnosis Date  . Supervision of normal pregnancy in first trimester 08/04/2014  . History of cesarean section 08/04/2014  . Medical history non-contributory    Past Surgical History  Procedure Laterality Date  . Cesarean section  03/28/2010    X1  . Cesarean section with bilateral tubal ligation Bilateral 02/16/2015    Procedure: CESAREAN SECTION WITH BILATERAL TUBAL LIGATION;  Surgeon: [name], MD;  Location: [facility];  Service: Obstetrics;  Laterality: Bilateral;   Family History  Problem Relation Age of Onset  . Lung disease Father   . Other Mother     herpes  . Hypertension Brother   . Cancer Maternal Grandmother     ovarian  . COPD Maternal  Grandfather   . Stroke Paternal Grandmother   . Heart disease Paternal Grandfather    Social History  Substance Use Topics  . Smoking status: Never Smoker   . Smokeless tobacco: Never Used  . Alcohol Use: No   OB History    Gravida Para Term Preterm AB TAB SAB Ectopic Multiple Living   2 2 2       0 2      Review of Systems  Constitutional: Negative for fever and chills.  Eyes: Negative for photophobia.  Gastrointestinal: Positive for nausea. Negative for vomiting.  Genitourinary: Negative for dysuria.  Musculoskeletal: Positive for back pain.  Neurological: Positive for numbness (and tingling of the LE's) and headaches. Negative for dizziness and weakness.  All other systems reviewed and are negative.    Allergies  Review of patient's allergies indicates no known allergies.  Home Medications   Prior to Admission medications   Medication Sig Start Date End Date Taking? Authorizing Provider  doxycycline (VIBRAMYCIN) 100 MG capsule Take 1 capsule (100 mg total) by mouth 2 (two) times daily. 04/05/15   Rolland Porter, MD  HYDROcodone-acetaminophen (NORCO/VICODIN) 5-325 MG tablet Take 2 tablets by mouth every 4 (four) hours as needed. 04/05/15   Rolland Porter, MD   BP 123/75 mmHg  Pulse 63  Temp(Src) 98 F (36.7 C) (Tympanic)  Resp 14  Ht 5\' 4"  (1.626 m)  Wt 200 lb (90.719 kg)  BMI 34.31 kg/m2  SpO2 100%  LMP 05/17/2015   Physical Exam  Constitutional:  She is oriented to person, place, and time. She appears well-developed and well-nourished. No distress.  HENT:  Head: Normocephalic and atraumatic.  Eyes: Conjunctivae and EOM are normal.  Neck: Full passive range of motion without pain and phonation normal. Neck supple. No tracheal deviation present. No Kernig's sign noted.  Cardiovascular: Normal rate.   Pulmonary/Chest: Effort normal. No respiratory distress.  Musculoskeletal: Normal range of motion. She exhibits tenderness.  Focal tenderness to lower lumbar spine,  +bilateral SLR,  5/5 strength against resistance to bilateral lower extremities.   Neurological: She is alert and oriented to person, place, and time. She has normal strength. Gait normal.  Reflex Scores:      Tricep reflexes are 2+ on the right side and 2+ on the left side.      Bicep reflexes are 2+ on the right side and 2+ on the left side.      Patellar reflexes are 2+ on the right side and 2+ on the left side.      Achilles reflexes are 2+ on the right side and 2+ on the left side. Skin: Skin is warm and dry.  Psychiatric: She has a normal mood and affect. Her behavior is normal.  Nursing note and vitals reviewed.   ED Course  Procedures (including critical care time) DIAGNOSTIC STUDIES: Oxygen Saturation is 100% on RA, normal by my interpretation.    COORDINATION OF CARE: 5:16 PM-Discussed treatment plan with pt at bedside and pt agreed to plan.     Labs Review Labs Reviewed - No data to display  Imaging Review Ct Head Wo Contrast  05/22/2015  CLINICAL DATA:  Photophobia starting 3 months ago. EXAM: CT HEAD WITHOUT CONTRAST TECHNIQUE: Contiguous axial images were obtained from the base of the skull through the vertex without intravenous contrast. COMPARISON:  None. FINDINGS: The brainstem, cerebellum, cerebral peduncles, thalami, basal ganglia, basilar cisterns, and ventricular system appear within normal limits. No intracranial hemorrhage, mass lesion, or acute CVA. IMPRESSION: 1.  No significant abnormality identified. Electronically Signed   By: Gaylyn RongWalter  Liebkemann M.D.   On: 05/22/2015 19:20       EKG Interpretation None      MDM   Final diagnoses:  Midline low back pain with sciatica, sciatica laterality unspecified  Nonintractable headache, unspecified chronicity pattern, unspecified headache type    Pt ambulates with steady gait.  Well appearing.  No concerning symptoms for emergent neurological or infectious process.   Pt agrees to arrange close GYN f/u.  Discussed care plan with Dr. Estell HarpinZammit.     I personally performed the services described in this documentation, which was scribed in my presence. The recorded information has been reviewed and is accurate.      Pauline Ausammy Monte Zinni, PA-C 05/26/15 2237  Bethann BerkshireJoseph Zammit, MD 05/29/15 1525

## 2015-05-22 NOTE — ED Notes (Signed)
Pt. Reports low back pain since epidural x 3 months ago. Denies any injuries. Denies GI/GU problems.

## 2015-05-22 NOTE — ED Notes (Signed)
Pt verbalized understanding of no driving and to use caution within 4 hours of taking pain meds due to meds cause drowsiness 

## 2015-05-26 ENCOUNTER — Ambulatory Visit (INDEPENDENT_AMBULATORY_CARE_PROVIDER_SITE_OTHER): Payer: Medicaid Other | Admitting: Obstetrics and Gynecology

## 2015-05-26 ENCOUNTER — Encounter: Payer: Self-pay | Admitting: Obstetrics and Gynecology

## 2015-05-26 VITALS — BP 100/72 | Ht 64.0 in | Wt 227.0 lb

## 2015-05-26 DIAGNOSIS — Z8669 Personal history of other diseases of the nervous system and sense organs: Secondary | ICD-10-CM

## 2015-05-26 DIAGNOSIS — M5489 Other dorsalgia: Secondary | ICD-10-CM | POA: Diagnosis not present

## 2015-05-26 MED ORDER — SUMATRIPTAN SUCCINATE 100 MG PO TABS: 100.0000 mg | ORAL_TABLET | Freq: Once | ORAL | Status: DC | PRN

## 2015-05-26 NOTE — Progress Notes (Signed)
Patient ID: Scarlette ShortsSamantha Estes, female   DOB: 12-Apr-1991, 25 y.o.   MRN: [id number] Pt here today for lower back pain and headaches since delivery. Pt states that she was seen in the ED the other day because she had numbness in her legs. Pt states that she has never had this before. Pt states that she can't do any kind of lifting due to the pain. Pt states that this has just gotten worse, was told to wait 3 months and see if it got better, she has waited but it has gotten much much worse.

## 2015-05-26 NOTE — Progress Notes (Signed)
Patient ID: Julie Estes Gunnoe, female   DOB: 19-Dec-1990, 25 y.o.   MRN: [id number]   [facility]  [name] [place] MRN [id number]  Date of birth: 19-Dec-1990  CC & HPI:  Julie Estes Brockel is a 25 y.o. female presenting today for moderate, daily, lower back and bilateral hip pain, worse on the right since cesarean on 02/16/15. Pt notes that the back pain is central, begins at the location of her spinal epidural and radiates to her gluteal cleft. She states she does not recall any significant complications or pain with the spinal epidural. Pt also complains of constant HA with blurry vision and nausea. She also states emesis x10/month with severe HAs. Pt states no modifying factors of HA, but notes that the lower back and hip pain is worsened with movement, bending, standing for extended periods of time, moving in a rocking chair and heavy lifting. She notes her pain is unaffected by reaching up. Pt notes that her HAs seem to be related to increased episodes of back pain. Pt states she was seen in the ED for her symptoms a few days ago and had CT head with no acute findings. She has been taking Percocet 1-2x/day and Flexeril in the evenings with mild to moderate relief of pain. She states the Flexeril helps her sleep. Pt notes no relief of HA with Tylenol or Excedrine. No h/o similar symptoms. Pt notes her periods have been normal since delivery and do not seem to be related to HA. She denies fever, chills, abdominal pain. She also denies trauma or injury. Pt states she is not breastfeeding.    ROS:  Positive for lower back pain and HA.  A complete 10 system review of systems was obtained and all systems are negative except as noted in the HPI and PMH.    Pertinent History Reviewed:   Reviewed: Significant for cesarean section, bilateral tubal ligation   Medical         Past Medical History  Diagnosis Date  . Supervision of normal pregnancy in first trimester 08/04/2014  .  History of cesarean section 08/04/2014  . Medical history non-contributory                               Surgical Hx:    Past Surgical History  Procedure Laterality Date  . Cesarean section  03/28/2010    X1  . Cesarean section with bilateral tubal ligation Bilateral 02/16/2015    Procedure: CESAREAN SECTION WITH BILATERAL TUBAL LIGATION;  Surgeon: Tilda BurrowJohn Gayland Nicol V, MD;  Location: [facility];  Service: Obstetrics;  Laterality: Bilateral;   Medications: Reviewed & Updated - see associated section                       Current outpatient prescriptions:  .  cyclobenzaprine (FLEXERIL) 10 MG tablet, Take 1 tablet (10 mg total) by mouth 3 (three) times daily as needed., Disp: 21 tablet, Rfl: 0 .  oxyCODONE-acetaminophen (PERCOCET/ROXICET) 5-325 MG tablet, Take 1 tablet by mouth every 4 (four) hours as needed. (Patient not taking: Reported on 05/26/2015), Disp: 15 tablet, Rfl: 0   Social History: Reviewed -  reports that she has never smoked. She has never used smokeless tobacco.  Objective Findings:  Vitals: Blood pressure 100/72, height 5\' 4"  (1.626 m), weight 227 lb (102.967 kg), last menstrual period 05/17/2015, not currently breastfeeding.  Physical Examination: General appearance - alert, well  appearing, and in no distress Mental status - alert, oriented to person, place, and time Abdomen - soft, nontender, nondistended, no masses or organomegaly. Well-healed surgical scar.  Back exam - No spasm, no CVA tenderness. Central midline pain. SLR right to 45 degrees. SLR left to 40 degrees.   Assessment & Plan:   A:  1. Central midline pain, not extending past the coccyx.  2. Moderate to severe HA, onset with severe back pain  3. Possible migraine variant 4. SI joint dysfunction? Or Post-spinal pain?   P:  1. Referral to neurologist   2. Continue Flexeril  3. Will prescribe Imitrex as a trial for HA.  4. Order CT lumbar  5. Follow up in 2 weeks.      By signing my name below, I, Doreatha Martin, attest that this documentation has been prepared under the direction and in the presence of [name], MD. Electronically Signed: Doreatha Martin, ED Scribe. 05/26/2015. 8:56 AM.  I personally performed the services described in this documentation, which was SCRIBED in my presence. The recorded information has been reviewed and considered accurate. It has been edited as necessary during review. [name], MD

## 2015-05-29 ENCOUNTER — Telehealth: Payer: Self-pay | Admitting: Obstetrics and Gynecology

## 2015-05-29 ENCOUNTER — Ambulatory Visit (HOSPITAL_COMMUNITY): Admission: RE | Admit: 2015-05-29 | Payer: Medicaid Other | Source: Ambulatory Visit

## 2015-05-29 ENCOUNTER — Other Ambulatory Visit: Payer: Self-pay | Admitting: Obstetrics and Gynecology

## 2015-05-29 DIAGNOSIS — M545 Low back pain, unspecified: Secondary | ICD-10-CM

## 2015-05-29 MED ORDER — BUTALBITAL-APAP-CAFFEINE 50-325-40 MG PO TABS: 1.0000 | ORAL_TABLET | ORAL | Status: DC | PRN

## 2015-05-29 NOTE — Telephone Encounter (Signed)
Pt states that she took the medication that was given to her on Friday, for headaches and she had terrible side effects. Pt states that her headache got worse, and she was in a cold sweat, nauseated, and felt terrible. Pt states that the flexiril has not helped er back at all.   I spoke with Dr. Emelda FearFerguson and he gave a verbal order for Fioricet and wanted the pt's appointment changed to this week.   I advised the pt of the medication change and that he wanted her appointment to be changed to this week. Pt verbalized understanding. Phone call was switched to front office and appointment was given.

## 2015-05-30 ENCOUNTER — Ambulatory Visit: Payer: Medicaid Other | Admitting: Obstetrics and Gynecology

## 2015-06-02 ENCOUNTER — Ambulatory Visit (INDEPENDENT_AMBULATORY_CARE_PROVIDER_SITE_OTHER): Payer: Medicaid Other | Admitting: Obstetrics and Gynecology

## 2015-06-02 ENCOUNTER — Encounter: Payer: Self-pay | Admitting: Obstetrics and Gynecology

## 2015-06-02 VITALS — BP 112/72 | Ht 64.0 in | Wt 220.0 lb

## 2015-06-02 DIAGNOSIS — M5489 Other dorsalgia: Secondary | ICD-10-CM | POA: Diagnosis not present

## 2015-06-02 MED ORDER — OXYCODONE-ACETAMINOPHEN 5-325 MG PO TABS: 1.0000 | ORAL_TABLET | Freq: Three times a day (TID) | ORAL | Status: DC | PRN

## 2015-06-02 NOTE — Progress Notes (Signed)
Patient ID: Julie Estes Rutten, female   DOB: 1991/04/15, 25 y.o.   MRN: [id number]   [facility]  [name] Diana MRN [id number]  Date of birth: 1991/04/15  CC & HPI:  Julie Estes Estill is a 25 y.o. female presenting today for followup of back pain since cesarean. MRI back was ordered but not yet completed, orders reaffirmed . Will confirm order today and check to see if prior authorization required.  ROS:  No change from last eval  Pertinent History Reviewed:   Reviewed: Significant for epidural at c/s  Medical         Past Medical History  Diagnosis Date  . Supervision of normal pregnancy in first trimester 08/04/2014  . History of cesarean section 08/04/2014  . Medical history non-contributory                               Surgical Hx:    Past Surgical History  Procedure Laterality Date  . Cesarean section  03/28/2010    X1  . Cesarean section with bilateral tubal ligation Bilateral 02/16/2015    Procedure: CESAREAN SECTION WITH BILATERAL TUBAL LIGATION;  Surgeon: Tilda BurrowJohn Syris Brookens V, MD;  Location: [facility];  Service: Obstetrics;  Laterality: Bilateral;   Medications: Reviewed & Updated - see associated section                       Current outpatient prescriptions:  .  cyclobenzaprine (FLEXERIL) 10 MG tablet, Take 1 tablet (10 mg total) by mouth 3 (three) times daily as needed., Disp: 21 tablet, Rfl: 0 .  butalbital-acetaminophen-caffeine (FIORICET, ESGIC) 50-325-40 MG tablet, Take 1 tablet by mouth every 4 (four) hours as needed for headache. (Patient not taking: Reported on 06/02/2015), Disp: 30 tablet, Rfl: 1   Social History: Reviewed -  reports that she has never smoked. She has never used smokeless tobacco.  Objective Findings:  Vitals: Blood pressure 112/72, height 5\' 4"  (1.626 m), weight 220 lb (99.791 kg), last menstrual period 05/17/2015, not currently breastfeeding.  Physical Examination: General appearance - alert, well appearing, and in no  distress, oriented to person, place, and time, overweight, well hydrated and anxious Mental status - alert, oriented to person, place, and time, normal mood, behavior, speech, dress, motor activity, and thought processes Eyes - pupils equal and reactive, extraocular eye movements intact   Assessment & Plan:   A:  1. Chronic back pain,midline s/p epidural  P:  1. 1. Pt MRI scheduled for 25 Jan 6pm 2. Prior auth being cleared 3. F/u 3 wk results.

## 2015-06-07 ENCOUNTER — Ambulatory Visit (HOSPITAL_COMMUNITY)
Admission: RE | Admit: 2015-06-07 | Discharge: 2015-06-07 | Disposition: A | Discharge status: Home / Self Care | Payer: Medicaid Other | Source: Ambulatory Visit | Attending: Obstetrics and Gynecology | Admitting: Obstetrics and Gynecology

## 2015-06-07 DIAGNOSIS — M545 Low back pain, unspecified: Secondary | ICD-10-CM

## 2015-06-07 DIAGNOSIS — M4687 Other specified inflammatory spondylopathies, lumbosacral region: Secondary | ICD-10-CM | POA: Insufficient documentation

## 2015-06-07 MED ORDER — GADOBENATE DIMEGLUMINE 529 MG/ML IV SOLN
20.0000 mL | Freq: Once | INTRAVENOUS | Status: AC | PRN
  Administered 2015-06-07: 20 mL via INTRAVENOUS

## 2015-06-12 ENCOUNTER — Ambulatory Visit: Payer: Medicaid Other | Admitting: Obstetrics and Gynecology

## 2015-06-14 ENCOUNTER — Ambulatory Visit (HOSPITAL_COMMUNITY): Payer: Medicaid Other

## 2015-06-23 ENCOUNTER — Encounter: Payer: Self-pay | Admitting: Obstetrics and Gynecology

## 2015-06-23 ENCOUNTER — Ambulatory Visit (INDEPENDENT_AMBULATORY_CARE_PROVIDER_SITE_OTHER): Payer: Medicaid Other | Admitting: Obstetrics and Gynecology

## 2015-06-23 VITALS — BP 110/78 | Ht 66.0 in | Wt 223.0 lb

## 2015-06-23 DIAGNOSIS — M5489 Other dorsalgia: Secondary | ICD-10-CM | POA: Diagnosis not present

## 2015-06-23 NOTE — Progress Notes (Signed)
Patient ID: Julie Estes, female   DOB: 07-22-1990, 25 y.o.   MRN: [id number] Pt here today for follow up and results. Pt states that things are about the same and no better.

## 2015-06-23 NOTE — Progress Notes (Signed)
Patient ID: Julie Estes, female   DOB: 1990/10/31, 25 y.o.   MRN: [id number]   [facility] Visit  [name] MRN [id number]  Date of birth: 1990-09-13  CC & HPI:  Julie Estes is a 25 y.o. female presenting today for follow up of MRI results. She continues to have lower back pain with occasional cramping. Pt states flexeril did not affect her pain.   ROS:  A complete 10 system review of systems was obtained and all systems are negative except as noted in the HPI and PMH.    Pertinent History Reviewed:   Reviewed: Significant for cesarean section with bilateral tubal ligation  Medical         Past Medical History  Diagnosis Date  . Supervision of normal pregnancy in first trimester 08/04/2014  . History of cesarean section 08/04/2014  . Medical history non-contributory                               Surgical Hx:    Past Surgical History  Procedure Laterality Date  . Cesarean section  03/28/2010    X1  . Cesarean section with bilateral tubal ligation Bilateral 02/16/2015    Procedure: CESAREAN SECTION WITH BILATERAL TUBAL LIGATION;  Surgeon: Julie Burrow, MD;  Location: [facility];  Service: Obstetrics;  Laterality: Bilateral;   Medications: Reviewed & Updated - see associated section                       Current outpatient prescriptions:  .  butalbital-acetaminophen-caffeine (FIORICET, ESGIC) 50-325-40 MG tablet, Take 1 tablet by mouth every 4 (four) hours as needed for headache., Disp: 30 tablet, Rfl: 1 .  cyclobenzaprine (FLEXERIL) 10 MG tablet, Take 1 tablet (10 mg total) by mouth 3 (three) times daily as needed., Disp: 21 tablet, Rfl: 0 .  oxyCODONE-acetaminophen (PERCOCET/ROXICET) 5-325 MG tablet, Take 1 tablet by mouth every 8 (eight) hours as needed for severe pain. (Patient not taking: Reported on 06/23/2015), Disp: 60 tablet, Rfl: 0   Social History: Reviewed -  reports that she has never smoked. She has never used smokeless  tobacco.  Objective Findings:  Vitals: Blood pressure 110/78, height  (1.676 m), weight 223 lb (101.152 kg), last menstrual period 05/17/2015, not currently breastfeeding.  Physical Examination: General appearance - alert, well appearing, and in no distress Mental status - alert, oriented to person, place, and time  Discussed with pt MRI results. At end of discussion, pt had opportunity to ask questions and has no further questions at this time. Advised pt to strengthen abdominal and back muscles, continue with back stretching. Discussed isometric exercises and conservative pain management, water exercises.   Greater than 50% was spent in counseling and coordination of care with the patient. Total time greater than: 15 minutes   Assessment & Plan:   A:  1. Chronic lower back pain s/p delivery and epidural 2. 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 Surgical intervention not indicated 2. Pt declines offer for prn robaxin  3. Follow up as needed  4. Conservative home pain management discussed     By signing my name below, I, Julie Estes, attest that this documentation has been prepared under the direction and in the presence of Julie Burrow, MD. Electronically  Signed: Doreatha Estes, ED Scribe. 06/23/2015. 9:38 AM.  I personally performed the services described in this documentation, which was SCRIBED in my presence. The recorded information has been reviewed and considered accurate. It has been edited as necessary during review. Julie Burrow, MD

## 2015-06-26 ENCOUNTER — Encounter (HOSPITAL_COMMUNITY): Payer: Self-pay | Admitting: Emergency Medicine

## 2015-06-26 ENCOUNTER — Emergency Department (HOSPITAL_COMMUNITY)
Admission: EM | Admit: 2015-06-26 | Discharge: 2015-06-26 | Disposition: A | Discharge status: Home / Self Care | Payer: Medicaid Other | Attending: Emergency Medicine | Admitting: Emergency Medicine

## 2015-06-26 DIAGNOSIS — J340 Abscess, furuncle and carbuncle of nose: Secondary | ICD-10-CM | POA: Diagnosis not present

## 2015-06-26 DIAGNOSIS — J3489 Other specified disorders of nose and nasal sinuses: Secondary | ICD-10-CM | POA: Diagnosis present

## 2015-06-26 MED ORDER — IBUPROFEN 800 MG PO TABS
800.0000 mg | ORAL_TABLET | Freq: Once | ORAL | Status: AC
  Administered 2015-06-26: 800 mg via ORAL
  Filled 2015-06-26: qty 1

## 2015-06-26 MED ORDER — SULFAMETHOXAZOLE-TRIMETHOPRIM 800-160 MG PO TABS: 1.0000 | ORAL_TABLET | Freq: Two times a day (BID) | ORAL | Status: AC

## 2015-06-26 MED ORDER — MUPIROCIN CALCIUM 2 % NA OINT: TOPICAL_OINTMENT | NASAL | Status: DC

## 2015-06-26 MED ORDER — SULFAMETHOXAZOLE-TRIMETHOPRIM 800-160 MG PO TABS
1.0000 | ORAL_TABLET | Freq: Once | ORAL | Status: AC
  Administered 2015-06-26: 1 via ORAL
  Filled 2015-06-26: qty 1

## 2015-06-26 MED ORDER — HYDROCODONE-ACETAMINOPHEN 5-325 MG PO TABS: ORAL_TABLET | ORAL | Status: DC

## 2015-06-26 NOTE — Discharge Instructions (Signed)
Cellulitis °Cellulitis is an infection of the skin and the tissue under the skin. The infected area is usually red and tender. This happens most often in the arms and lower legs. °HOME CARE  °· Take your antibiotic medicine as told. Finish the medicine even if you start to feel better. °· Keep the infected arm or leg raised (elevated). °· Put a warm cloth on the area up to 4 times per day. °· Only take medicines as told by your doctor. °· Keep all doctor visits as told. °GET HELP IF: °· You see red streaks on the skin coming from the infected area. °· Your red area gets bigger or turns a dark color. °· Your bone or joint under the infected area is painful after the skin heals. °· Your infection comes back in the same area or different area. °· You have a puffy (swollen) bump in the infected area. °· You have new symptoms. °· You have a fever. °GET HELP RIGHT AWAY IF:  °· You feel very sleepy. °· You throw up (vomit) or have watery poop (diarrhea). °· You feel sick and have muscle aches and pains. °  °This information is not intended to replace advice given to you by your health care provider. Make sure you discuss any questions you have with your health care provider. °  °Document Released: 10/23/2007 Document Revised: 01/25/2015 Document Reviewed: 07/22/2011 °Elsevier Interactive Patient Education ©2016 Elsevier Inc. ° °

## 2015-06-26 NOTE — ED Notes (Signed)
Pt c/o rt nare pain, redness and swelling x 3 days.

## 2015-06-26 NOTE — ED Notes (Signed)
Pt verbalized understanding of no driving and to use caution within 4 hours of taking pain meds due to meds cause drowsiness.  Instructed pt to take all of antibiotics as prescribed. 

## 2015-06-29 NOTE — ED Provider Notes (Signed)
CSN: [id number]     Arrival date & time 06/26/15  2024 History   First MD Initiated Contact with Patient 06/26/15 2127     Chief Complaint  Patient presents with  . Nose Problem     (Consider location/radiation/quality/duration/timing/severity/associated sxs/prior Treatment) HPI  Julie Estes is a 25 y.o. female who presents to the Emergency Department complaining of pain and redness of the right nostril for 3 days.  She reports having a "bump" in her right nostril that has been increasing in pain.  She reports recurrent boils in multiple areas.  She states pain is worse with palpation.  She denies fever, chills, difficulty swallowing, neck pain and swelling of her face.  She has not taken any medications for her symptoms   Past Medical History  Diagnosis Date  . Supervision of normal pregnancy in first trimester 08/04/2014  . History of cesarean section 08/04/2014  . Medical history non-contributory    Past Surgical History  Procedure Laterality Date  . Cesarean section  03/28/2010    X1  . Cesarean section with bilateral tubal ligation Bilateral 02/16/2015    Procedure: CESAREAN SECTION WITH BILATERAL TUBAL LIGATION;  Surgeon: [name], MD;  Location: [facility];  Service: Obstetrics;  Laterality: Bilateral;   Family History  Problem Relation Age of Onset  . Lung disease Father   . Other Mother     herpes  . Hypertension Brother   . Cancer Maternal Grandmother     ovarian  . COPD Maternal Grandfather   . Stroke Paternal Grandmother   . Heart disease Paternal Grandfather    Social History  Substance Use Topics  . Smoking status: Never Smoker   . Smokeless tobacco: Never Used  . Alcohol Use: No   OB History    Gravida Para Term Preterm AB TAB SAB Ectopic Multiple Living   0 2     Review of Systems  Constitutional: Negative for fever and chills.  HENT: Negative for ear pain, facial swelling and sore throat.   Gastrointestinal: Negative for nausea and  vomiting.  Musculoskeletal: Negative for joint swelling and arthralgias.  Skin: Positive for color change.       Abscess   Hematological: Negative for adenopathy.  All other systems reviewed and are negative.     Allergies  Review of patient's allergies indicates no known allergies.  Home Medications   Prior to Admission medications   Medication Sig Start Date End Date Taking? Authorizing Provider  butalbital-acetaminophen-caffeine (FIORICET, ESGIC) [phone] MG tablet Take 1 tablet by mouth every 4 (four) hours as needed for headache. 05/29/15  Yes [name], MD  cyclobenzaprine (FLEXERIL) 10 MG tablet Take 1 tablet (10 mg total) by mouth 3 (three) times daily as needed. Patient not taking: Reported on 06/26/2015 05/22/15   Pauline Aus, PA-C  HYDROcodone-acetaminophen (NORCO/VICODIN) 5-325 MG tablet Take one tab po q 4-6 hrs prn pain 06/26/15   Rajni Holsworth, PA-C  mupirocin nasal ointment (BACTROBAN) 2 % Apply to the affected area TID x 10 days 06/26/15   Emilya Justen, PA-C  oxyCODONE-acetaminophen (PERCOCET/ROXICET) 5-325 MG tablet Take 1 tablet by mouth every 8 (eight) hours as needed for severe pain. Patient not taking: Reported on 06/23/2015 06/02/15   [name], MD  sulfamethoxazole-trimethoprim (BACTRIM DS,SEPTRA DS) 800-160 MG tablet Take 1 tablet by mouth 2 (two) times daily. For 10 days 06/26/15 07/03/15  Jennaya Pogue, PA-C   BP 135/84 mmHg  Pulse  75  Temp(Src) 98.5 F (36.9 C) (Temporal)  Resp 14  Ht  (1.676 m)  Wt 99.791 kg  BMI 35.53 kg/m2  SpO2 100%  LMP 06/25/2015 Physical Exam  Constitutional: She is oriented to person, place, and time. She appears well-developed and well-nourished. No distress.  HENT:  Head: Normocephalic and atraumatic.  Mouth/Throat: Uvula is midline, oropharynx is clear and moist and mucous membranes are normal.  Single small pustule to the right nostril.  Mild purulent drainage.  Mild localized erythema to the right outer  nostril. No facial edema  Neck: Normal range of motion. Neck supple.  Cardiovascular: Normal rate, regular rhythm and normal heart sounds.   No murmur heard. Pulmonary/Chest: Effort normal and breath sounds normal. No respiratory distress.  Neurological: She is alert and oriented to person, place, and time. She exhibits normal muscle tone. Coordination normal.  Skin: Skin is warm and dry.  Psychiatric: She has a normal mood and affect.  Nursing note and vitals reviewed.   ED Course  Procedures (including critical care time) Labs Review Labs Reviewed - No data to display  Imaging Review No results found. I have personally reviewed and evaluated these images and lab results as part of my medical decision-making.   EKG Interpretation None      MDM   Final diagnoses:  Cellulitis of sidewall of nose   Pt with single pustule inside the right nostril.  Mild drainage.  No facial edema.  She agrees to warm wet compresses rx bactroban and bactrim.  Also agrees to close f/u if not improving    Pauline Aus, PA-C 06/29/15 1247  Glynn Octave, MD 06/29/15 1301

## 2015-08-16 ENCOUNTER — Emergency Department (HOSPITAL_COMMUNITY): Payer: Medicaid Other

## 2015-08-16 ENCOUNTER — Emergency Department (HOSPITAL_COMMUNITY)
Admission: EM | Admit: 2015-08-16 | Discharge: 2015-08-16 | Disposition: A | Discharge status: Home / Self Care | Payer: Medicaid Other | Attending: Emergency Medicine | Admitting: Emergency Medicine

## 2015-08-16 ENCOUNTER — Encounter (HOSPITAL_COMMUNITY): Payer: Self-pay | Admitting: *Deleted

## 2015-08-16 DIAGNOSIS — W010XXA Fall on same level from slipping, tripping and stumbling without subsequent striking against object, initial encounter: Secondary | ICD-10-CM | POA: Diagnosis not present

## 2015-08-16 DIAGNOSIS — S93401A Sprain of unspecified ligament of right ankle, initial encounter: Secondary | ICD-10-CM | POA: Diagnosis not present

## 2015-08-16 DIAGNOSIS — Y929 Unspecified place or not applicable: Secondary | ICD-10-CM | POA: Insufficient documentation

## 2015-08-16 DIAGNOSIS — Y999 Unspecified external cause status: Secondary | ICD-10-CM | POA: Insufficient documentation

## 2015-08-16 DIAGNOSIS — Y939 Activity, unspecified: Secondary | ICD-10-CM | POA: Insufficient documentation

## 2015-08-16 DIAGNOSIS — S99911A Unspecified injury of right ankle, initial encounter: Secondary | ICD-10-CM | POA: Diagnosis present

## 2015-08-16 MED ORDER — ACETAMINOPHEN 500 MG PO TABS
1000.0000 mg | ORAL_TABLET | Freq: Once | ORAL | Status: AC
  Administered 2015-08-16: 1000 mg via ORAL
  Filled 2015-08-16: qty 2

## 2015-08-16 MED ORDER — IBUPROFEN 800 MG PO TABS
800.0000 mg | ORAL_TABLET | Freq: Once | ORAL | Status: AC
  Administered 2015-08-16: 800 mg via ORAL
  Filled 2015-08-16: qty 1

## 2015-08-16 NOTE — ED Provider Notes (Signed)
CSN: [id number]     Arrival date & time 08/16/15  1345 History   First MD Initiated Contact with Patient 08/16/15 1537     Chief Complaint  Patient presents with  . Ankle Pain     (Consider location/radiation/quality/duration/timing/severity/associated sxs/prior Treatment) Patient is a 25 y.o. female presenting with ankle pain. The history is provided by the patient.  Ankle Pain Location:  Ankle Time since incident:  5 days Injury: yes   Mechanism of injury: fall   Fall:    Impact surface:  Hard floor   Point of impact:  Unable to specify Pain details:    Quality:  Aching   Radiates to:  Does not radiate   Severity:  Moderate   Onset quality:  Gradual   Duration:  5 days   Timing:  Intermittent   Progression:  Worsening Chronicity:  New Dislocation: no   Relieved by:  Nothing Worsened by:  Bearing weight Associated symptoms: no fever, no numbness and no tingling   Risk factors: no frequent fractures and no known bone disorder     Past Medical History  Diagnosis Date  . Supervision of normal pregnancy in first trimester 08/04/2014  . History of cesarean section 08/04/2014  . Medical history non-contributory    Past Surgical History  Procedure Laterality Date  . Cesarean section  03/28/2010    X1  . Cesarean section with bilateral tubal ligation Bilateral 02/16/2015    Procedure: CESAREAN SECTION WITH BILATERAL TUBAL LIGATION;  Surgeon: [name], MD;  Location: [facility];  Service: Obstetrics;  Laterality: Bilateral;   Family History  Problem Relation Age of Onset  . Lung disease Father   . Other Mother     herpes  . Hypertension Brother   . Cancer Maternal Grandmother     ovarian  . COPD Maternal Grandfather   . Stroke Paternal Grandmother   . Heart disease Paternal Grandfather    Social History  Substance Use Topics  . Smoking status: Never Smoker   . Smokeless tobacco: Never Used  . Alcohol Use: No   OB History    Gravida Para Term Preterm AB TAB  SAB Ectopic Multiple Living   0 2     Review of Systems  Constitutional: Negative for fever.  Musculoskeletal: Positive for arthralgias.  All other systems reviewed and are negative.     Allergies  Review of patient's allergies indicates no known allergies.  Home Medications   Prior to Admission medications   Medication Sig Start Date End Date Taking? Authorizing Provider  butalbital-acetaminophen-caffeine (FIORICET, ESGIC) [phone] MG tablet Take 1 tablet by mouth every 4 (four) hours as needed for headache. 05/29/15   [name], MD  cyclobenzaprine (FLEXERIL) 10 MG tablet Take 1 tablet (10 mg total) by mouth 3 (three) times daily as needed. Patient not taking: Reported on 06/26/2015 05/22/15   Pauline Aus, PA-C  HYDROcodone-acetaminophen (NORCO/VICODIN) 5-325 MG tablet Take one tab po q 4-6 hrs prn pain 06/26/15   Tammy Triplett, PA-C  mupirocin nasal ointment (BACTROBAN) 2 % Apply to the affected area TID x 10 days 06/26/15   Tammy Triplett, PA-C  oxyCODONE-acetaminophen (PERCOCET/ROXICET) 5-325 MG tablet Take 1 tablet by mouth every 8 (eight) hours as needed for severe pain. Patient not taking: Reported on 06/23/2015 06/02/15   [name], MD   BP 129/73 mmHg  Pulse 72  Temp(Src) 97.7 F (36.5 C) (Oral)  Resp 18  Ht  5\' 6"  (1.676 m)  Wt 79.379 kg  BMI 28.26 kg/m2  SpO2 100%  LMP 07/26/2015 Physical Exam  Constitutional: She is oriented to person, place, and time. She appears well-developed and well-nourished.  Non-toxic appearance.  HENT:  Head: Normocephalic.  Right Ear: Tympanic membrane and external ear normal.  Left Ear: Tympanic membrane and external ear normal.  Eyes: EOM and lids are normal. Pupils are equal, round, and reactive to light.  Neck: Normal range of motion. Neck supple. Carotid bruit is not present.  Cardiovascular: Normal rate, regular rhythm, normal heart sounds, intact distal pulses and normal pulses.   Pulmonary/Chest: Breath sounds  normal. No respiratory distress.  Abdominal: Soft. Bowel sounds are normal. There is no tenderness. There is no guarding.  Musculoskeletal: Normal range of motion.  There is medial malleolus greater than lateral malleolus swelling and tenderness of the right ankle. There is full range of motion of the toes. Dorsalis pedis pulses 2+. The capillary refill is less than 2 seconds. The Achilles tendon is intact. There no temperature changes of the right lower extremity. Is full range of motion of the right knee.  Lymphadenopathy:       Head (right side): No submandibular adenopathy present.       Head (left side): No submandibular adenopathy present.    She has no cervical adenopathy.  Neurological: She is alert and oriented to person, place, and time. She has normal strength. No cranial nerve deficit or sensory deficit.  Skin: Skin is warm and dry.  Psychiatric: She has a normal mood and affect. Her speech is normal.  Nursing note and vitals reviewed.   ED Course  Procedures (including critical care time) Labs Review Labs Reviewed - No data to display  Imaging Review Dg Ankle Complete Right  08/16/2015  CLINICAL DATA:  Tripped and fell today.  Right ankle pain. EXAM: RIGHT ANKLE - COMPLETE 3+ VIEW COMPARISON:  None. FINDINGS: The ankle mortise is maintained. No acute fracture or osteochondral lesion. No joint effusion. The mid and hindfoot bony structures are intact. IMPRESSION: No acute ankle fracture. Electronically Signed   By: Rudie MeyerP.  Gallerani M.D.   On: 08/16/2015 14:29   I have personally reviewed and evaluated these images and lab results as part of my medical decision-making.   EKG Interpretation None      MDM  Vital signs are well within normal limits. X-ray of the right ankle is negative for fracture or dislocation. The examination favors a strain/sprain of the right ankle. The patient is fitted with an ankle stirrup splint. The patient will use ibuprofen and Tylenol for soreness.  I've instructed the patient on the elevation. Crutches were offered, but the patient declines at this time. Patient will follow-up with orthopedics if not improving.    Final diagnoses:  None    **I have reviewed nursing notes, vital signs, and all appropriate lab and imaging results for this patient.Ivery Quale*    Yoskar Murrillo, PA-C 08/16/15 1606  Samuel JesterKathleen McManus, DO 08/19/15 1229

## 2015-08-16 NOTE — Discharge Instructions (Signed)
Your x-ray is negative for fracture or dislocation. Your examination favors an ankle sprain. Please use the ankle stirrup splint over the next 10 days. Lee's elevate your ankle above your waist if you're sitting, and above your heart if you're lying down. Please use 600 mg of ibuprofen, and 500 mg of Tylenol every 6 hours for discomfort if needed. Please see the orthopedic specialist listed above or the orthopedic specialist of your choice if not improving. Ankle Sprain An ankle sprain is an injury to the strong, fibrous tissues (ligaments) that hold your ankle bones together.  HOME CARE   Put ice on your ankle for 1-2 days or as told by your doctor.  Put ice in a plastic bag.  Place a towel between your skin and the bag.  Leave the ice on for 15-20 minutes at a time, every 2 hours while you are awake.  Only take medicine as told by your doctor.  Raise (elevate) your injured ankle above the level of your heart as much as possible for 2-3 days.  Use crutches if your doctor tells you to. Slowly put your own weight on the affected ankle. Use the crutches until you can walk without pain.  If you have a plaster splint:  Do not rest it on anything harder than a pillow for 24 hours.  Do not put weight on it.  Do not get it wet.  Take it off to shower or bathe.  If given, use an elastic wrap or support stocking for support. Take the wrap off if your toes lose feeling (numb), tingle, or turn cold or blue.  If you have an air splint:  Add or let out air to make it comfortable.  Take it off at night and to shower and bathe.  Wiggle your toes and move your ankle up and down often while you are wearing it. GET HELP IF:  You have rapidly increasing bruising or puffiness (swelling).  Your toes feel very cold.  You lose feeling in your foot.  Your medicine does not help your pain. GET HELP RIGHT AWAY IF:   Your toes lose feeling (numb) or turn blue.  You have severe pain that is  increasing. MAKE SURE YOU:   Understand these instructions.  Will watch your condition.  Will get help right away if you are not doing well or get worse.   This information is not intended to replace advice given to you by your health care provider. Make sure you discuss any questions you have with your health care provider.   Document Released: 10/23/2007 Document Revised: 05/27/2014 Document Reviewed: 11/18/2011 Elsevier Interactive Patient Education [employer]2016 Elsevier Inc.

## 2015-08-16 NOTE — ED Notes (Signed)
Pt reports mechanical fall last Friday. C/o right ankle pain.

## 2015-10-10 ENCOUNTER — Encounter (HOSPITAL_COMMUNITY): Payer: Self-pay | Admitting: Emergency Medicine

## 2015-10-10 ENCOUNTER — Emergency Department (HOSPITAL_COMMUNITY)
Admission: EM | Admit: 2015-10-10 | Discharge: 2015-10-10 | Disposition: A | Discharge status: Home / Self Care | Payer: Medicaid Other | Attending: Emergency Medicine | Admitting: Emergency Medicine

## 2015-10-10 DIAGNOSIS — N764 Abscess of vulva: Secondary | ICD-10-CM | POA: Insufficient documentation

## 2015-10-10 MED ORDER — LIDOCAINE HCL (PF) 1 % IJ SOLN
5.0000 mL | Freq: Once | INTRAMUSCULAR | Status: AC
  Administered 2015-10-10: 5 mL via INTRADERMAL
  Filled 2015-10-10: qty 5

## 2015-10-10 MED ORDER — HYDROCODONE-ACETAMINOPHEN 5-325 MG PO TABS: 2.0000 | ORAL_TABLET | ORAL | Status: AC | PRN

## 2015-10-10 MED ORDER — HYDROCODONE-ACETAMINOPHEN 5-325 MG PO TABS: 2.0000 | ORAL_TABLET | ORAL | Status: DC | PRN

## 2015-10-10 NOTE — ED Notes (Signed)
Pt states abscess started a week ago, has only gotten bigger. Pt denies any fever, N/V.

## 2015-10-10 NOTE — ED Notes (Signed)
Pt has a nickel size soft, raised area to the lower vaginal area. No drainage noted, not warm to touch.

## 2015-10-10 NOTE — ED Notes (Signed)
Pt alert & oriented x4, stable gait. Patient given discharge instructions, paperwork & prescription(s). Patient informed not to drive, operate any equipment & handel any important documents 4 hours after taking pain medication. Patient  instructed to stop at the registration desk to finish any additional paperwork. Patient  verbalized understanding. Pt left department w/ no further questions. 

## 2015-10-10 NOTE — ED Provider Notes (Signed)
CSN: [id number]     Arrival date & time 10/10/15  1412 History   First MD Initiated Contact with Patient 10/10/15 1537     Chief Complaint  Patient presents with  . Abscess     (Consider location/radiation/quality/duration/timing/severity/associated sxs/prior Treatment) HPI   Patient is a 25 year old female with no significant past medical history who presents to the ED today with a abscess in her vaginal area. Patient states she noticed a bump about a week ago and has progressively gotten bigger and more painful. The pain is now constant, throbbing, 9/10, nonradiating. She denies fever, chills, dysuria, hematuria, vaginal discharge. Pt states she shaves her vaginal,bikini area regularly.   Past Medical History  Diagnosis Date  . Supervision of normal pregnancy in first trimester 08/04/2014  . History of cesarean section 08/04/2014  . Medical history non-contributory    Past Surgical History  Procedure Laterality Date  . Cesarean section  03/28/2010    X1  . Cesarean section with bilateral tubal ligation Bilateral 02/16/2015    Procedure: CESAREAN SECTION WITH BILATERAL TUBAL LIGATION;  Surgeon: [name], MD;  Location: [facility];  Service: Obstetrics;  Laterality: Bilateral;   Family History  Problem Relation Age of Onset  . Lung disease Father   . Other Mother     herpes  . Hypertension Brother   . Cancer Maternal Grandmother     ovarian  . COPD Maternal Grandfather   . Stroke Paternal Grandmother   . Heart disease Paternal Grandfather    Social History  Substance Use Topics  . Smoking status: Never Smoker   . Smokeless tobacco: Never Used  . Alcohol Use: No   OB History    Gravida Para Term Preterm AB TAB SAB Ectopic Multiple Living   0 2     Review of Systems  Constitutional: Negative for fever and chills.  Respiratory: Negative for shortness of breath.   Cardiovascular: Negative for chest pain.  Gastrointestinal: Negative for nausea, vomiting and  abdominal pain.  Genitourinary: Negative for dysuria, hematuria, vaginal bleeding and vaginal discharge.  Skin: Positive for wound.      Allergies  Review of patient's allergies indicates not on file.  Home Medications   Prior to Admission medications   Not on File   BP 133/66 mmHg  Pulse 66  Temp(Src) 97 F (36.1 C) (Temporal)  Resp 18  Ht  (1.676 m)  Wt 79.379 kg  BMI 28.26 kg/m2  SpO2 100%  LMP 09/18/2015 Physical Exam  Constitutional: She appears well-developed and well-nourished. No distress.  HENT:  Head: Normocephalic and atraumatic.  Eyes: Conjunctivae are normal.  Cardiovascular: Normal rate.   Pulmonary/Chest: Effort normal. No respiratory distress.  Genitourinary:    There is no rash, tenderness or lesion on the right labia. There is no rash, tenderness or lesion on the left labia.  No signs of surrounding cellulitis  Musculoskeletal: Normal range of motion.  Neurological: She is alert. Coordination normal.  Skin: Skin is warm and dry. No rash noted. She is not diaphoretic.  Psychiatric: She has a normal mood and affect. Her behavior is normal.  Nursing note and vitals reviewed.   ED Course  .Marland KitchenIncision and Drainage Date/Time: 10/10/2015 5:21 PM Performed by: Rhona Raider, Salia Cangemi L Authorized by: Mattie Marlin L Consent: Verbal consent obtained. Risks and benefits: risks, benefits and alternatives were discussed Consent given by: patient Patient understanding: patient states understanding of the procedure being performed Required items:  required blood products, implants, devices, and special equipment available Patient identity confirmed: verbally with patient and arm band Time out: Immediately prior to procedure a "time out" was called to verify the correct patient, procedure, equipment, support staff and site/side marked as required. Type: abscess Body area: anogenital Location details: vulva Anesthesia: local infiltration Local anesthetic:  lidocaine 1% with epinephrine Anesthetic total: 3 ml Patient sedated: no Scalpel size: 11 Needle gauge: 25. Incision type: single straight Incision depth: subcutaneous Complexity: simple Drainage: purulent and  bloody Drainage amount: moderate Wound treatment: wound left open Patient tolerance: Patient tolerated the procedure well with no immediate complications   (including critical care time) Labs Review Labs Reviewed - No data to display  Imaging Review No results found. I have personally reviewed and evaluated these images and lab results as part of my medical decision-making.   EKG Interpretation None      MDM   Final diagnoses:  None    Patient with skin abscess amenable to incision and drainage.  Abscess was not large enough to warrant packing or drain,  wound recheck in 2 days. Encouraged home warm soaks and flushing. No signs of cellulitis of surrounding skin.  Will d/c to home with pain meds.  No antibiotic therapy is indicated.Instructed patient to follow-up in 2 days at her primary care provider's office or  ED for wound recheck. Discussed strict return precautions. She expressed understanding to the discharge instructions.    Jerre SimonJessica L Brnadon Eoff, PA 10/10/15 1731  Pricilla LovelessScott Goldston, MD 10/12/15 1106

## 2015-10-10 NOTE — ED Provider Notes (Signed)
Patient brought Rx back because pharmacy would not fill due to [place]'s medicaid not ready yet. I have destroyed previous Rx and written similar hydrocodone Rx.  Pricilla LovelessScott Adrick Kestler, MD 10/10/15 [phone]

## 2015-10-10 NOTE — ED Notes (Signed)
Patient complaining of abscess to right side of groin area.

## 2015-10-10 NOTE — Discharge Instructions (Signed)
Follow-up with your primary care provider or here at the ED in 2 days to have your wound recheck.  Return to the emergency department if you experience worsening swelling, worsening pain, redness around the wound, fever, chills.  Incision and Drainage Incision and drainage is a procedure in which a sac-like structure (cystic structure) is opened and drained. The area to be drained usually contains material such as pus, fluid, or blood.  LET YOUR CAREGIVER KNOW ABOUT:   Allergies to medicine.  Medicines taken, including vitamins, herbs, eyedrops, over-the-counter medicines, and creams.  Use of steroids (by mouth or creams).  Previous problems with anesthetics or numbing medicines.  History of bleeding problems or blood clots.  Previous surgery.  Other health problems, including diabetes and kidney problems.  Possibility of pregnancy, if this applies. RISKS AND COMPLICATIONS  Pain.  Bleeding.  Scarring.  Infection. BEFORE THE PROCEDURE  You may need to have an ultrasound or other imaging tests to see how large or deep your cystic structure is. Blood tests may also be used to determine if you have an infection or how severe the infection is. You may need to have a tetanus shot. PROCEDURE  The affected area is cleaned with a cleaning fluid. The cyst area will then be numbed with a medicine (local anesthetic). A small incision will be made in the cystic structure. A syringe or catheter may be used to drain the contents of the cystic structure, or the contents may be squeezed out. The area will then be flushed with a cleansing solution. After cleansing the area, it is often gently packed with a gauze or another wound dressing. Once it is packed, it will be covered with gauze and tape or some other type of wound dressing. AFTER THE PROCEDURE   Often, you will be allowed to go home right after the procedure.  You may be given antibiotic medicine to prevent or heal an infection.  If  the area was packed with gauze or some other wound dressing, you will likely need to come back in 1 to 2 days to get it removed.  The area should heal in about 14 days.   This information is not intended to replace advice given to you by your health care provider. Make sure you discuss any questions you have with your health care provider.   Document Released: 10/30/2000 Document Revised: 11/05/2011 Document Reviewed: 07/01/2011 Elsevier Interactive Patient Education [employer]2016 [employer]

## 2016-06-05 IMAGING — CT CT HEAD W/O CM
1 series · 16 of 30 positions shown, 20 images · non-contrast
Comparison: None.

CLINICAL DATA: Photophobia starting 3 months ago.

EXAM:
CT HEAD WITHOUT CONTRAST
TECHNIQUE: Contiguous axial images were obtained from the base of the skull
through the vertex without intravenous contrast.

[Series 2: headseq 4.8 h37s · axial · 0.43mm/px · z∈[+109,+263]mm · 16 of 36 slices shown, 20 images]
[im 2/36  brain]
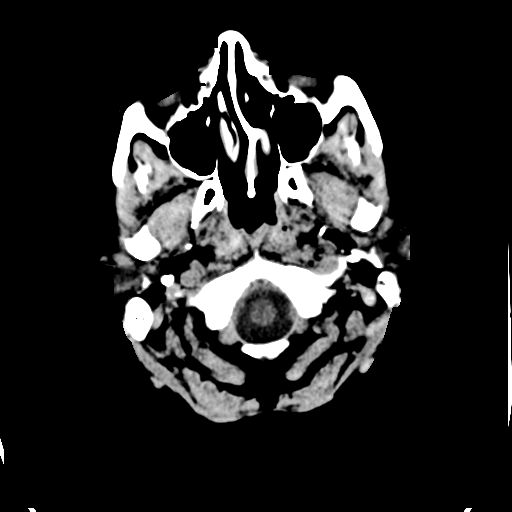
[im 2/36  bone]
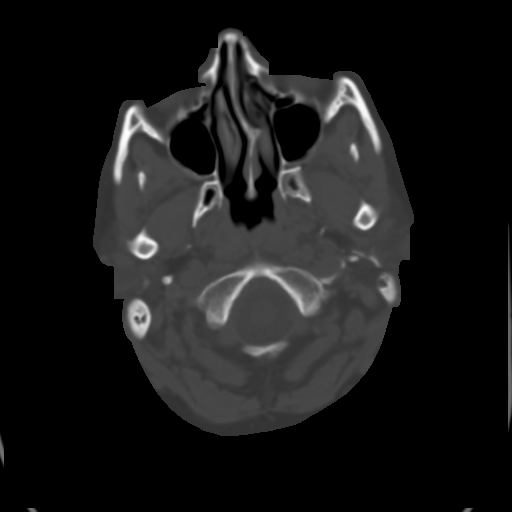
[im 4/36  brain]
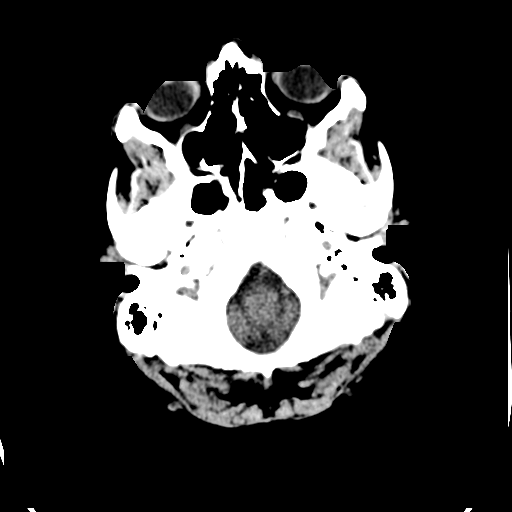
[im 7/36  brain]
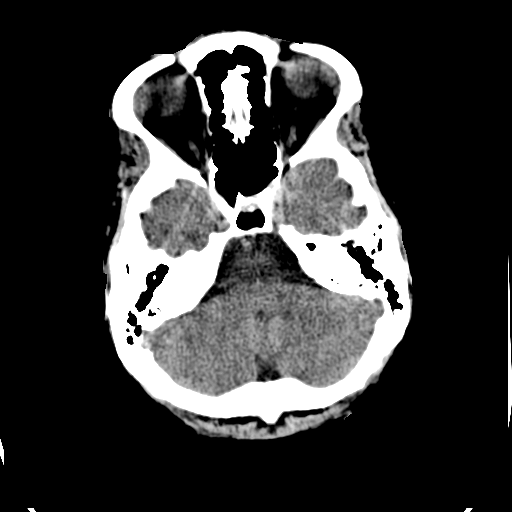
[im 9/36  brain]
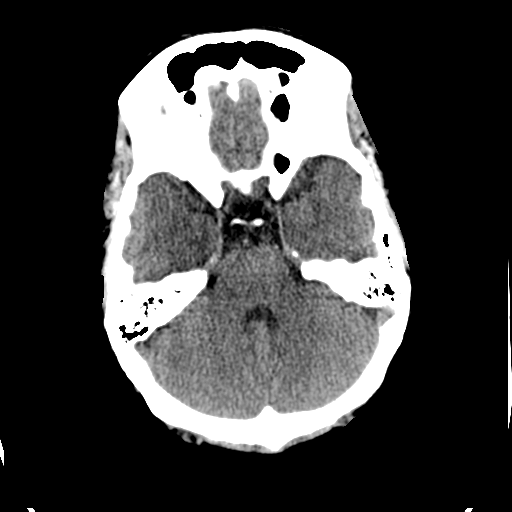
[im 10/36  brain]
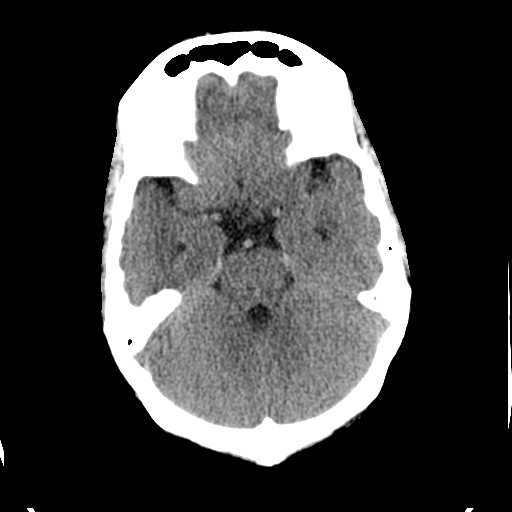
[im 10/36  bone]
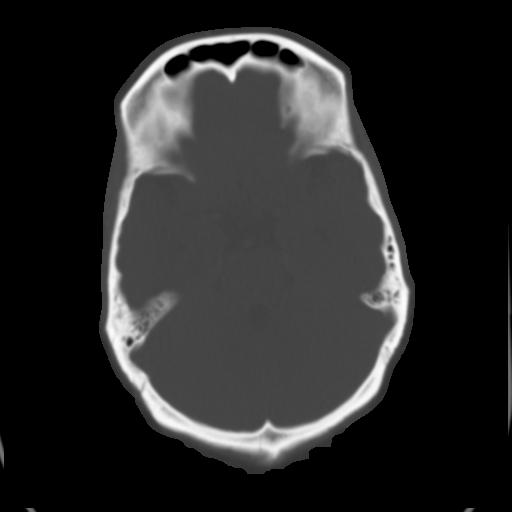
[im 13/36  brain]
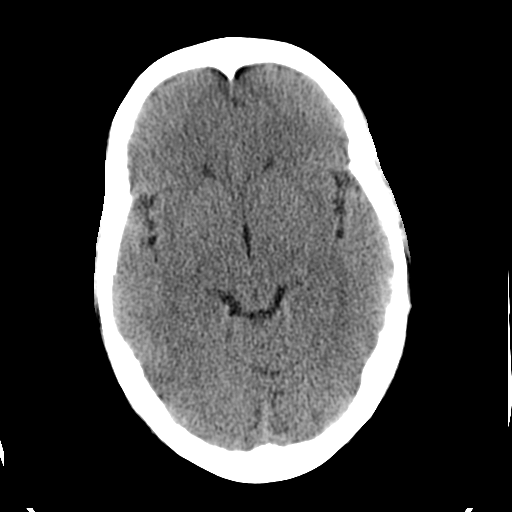
[im 15/36  brain]
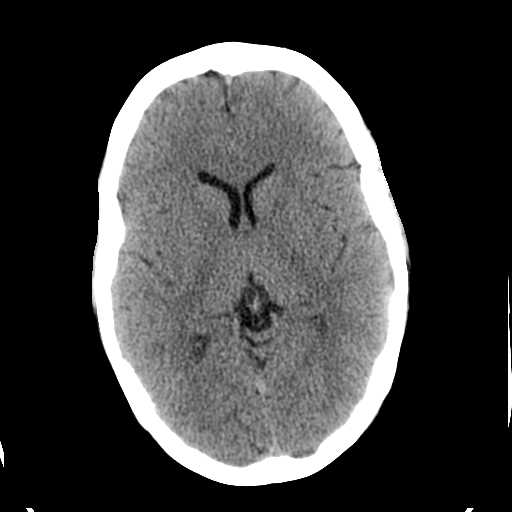
[im 17/36  brain]
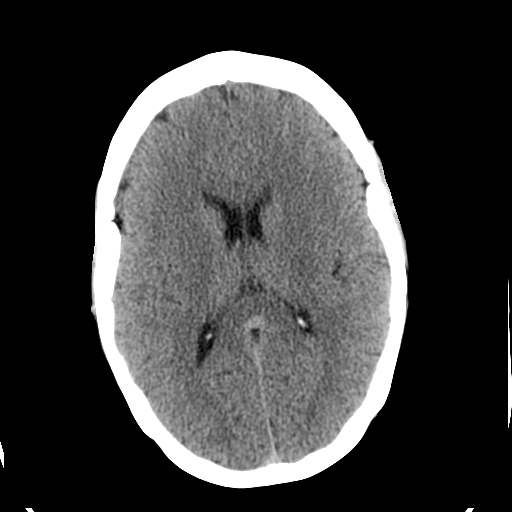
[im 19/36  brain]
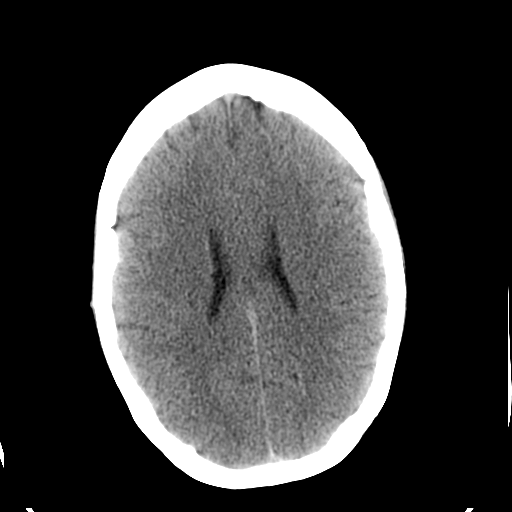
[im 19/36  bone]
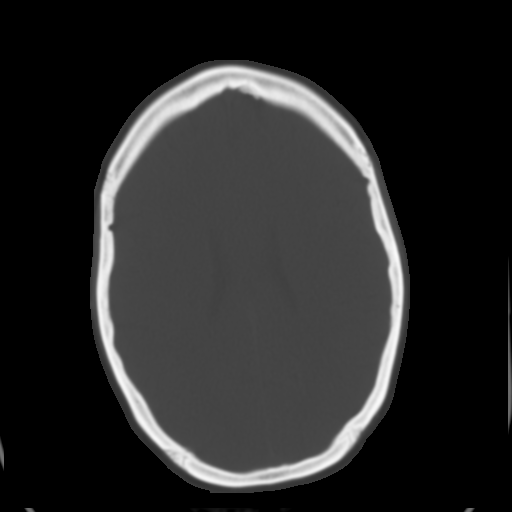
[im 21/36  brain]
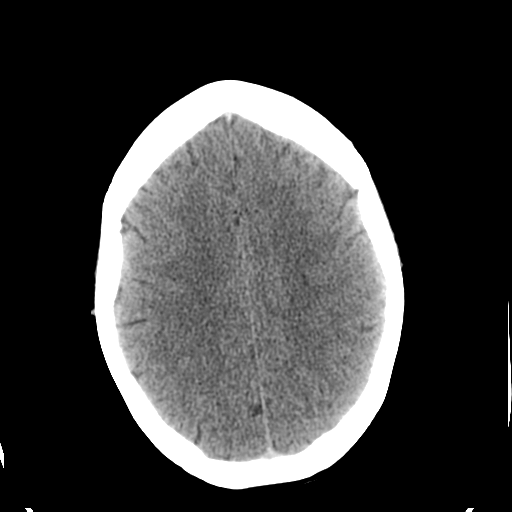
[im 23/36  brain]
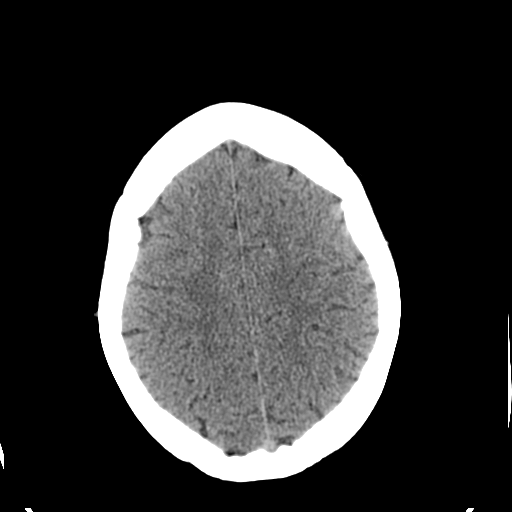
[im 26/36  brain]
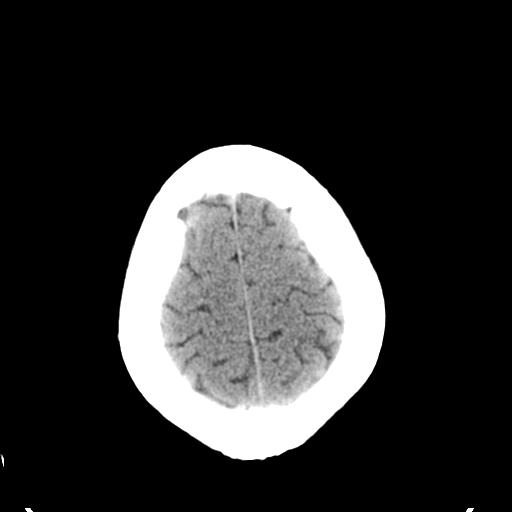
[im 27/36  brain]
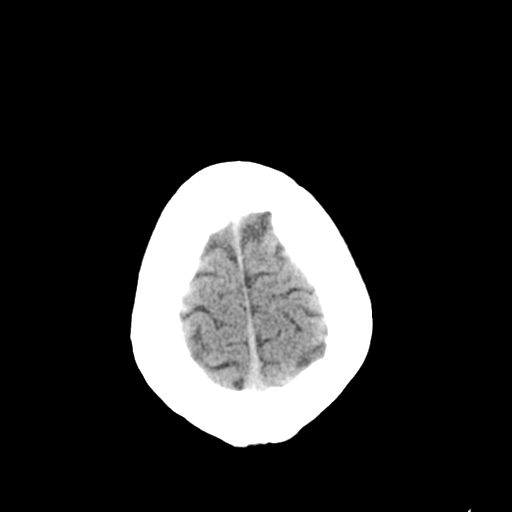
[im 27/36  bone]
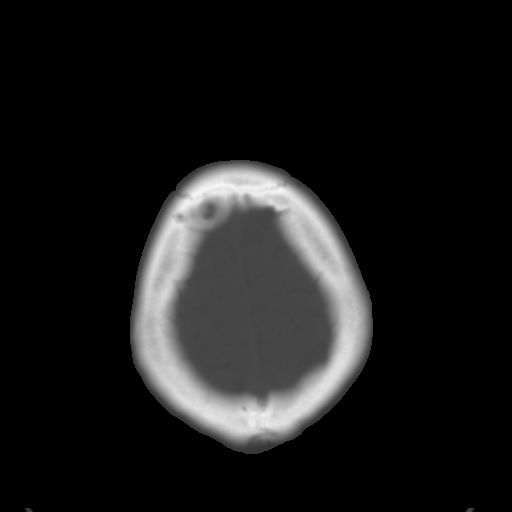
[im 29/36  brain]
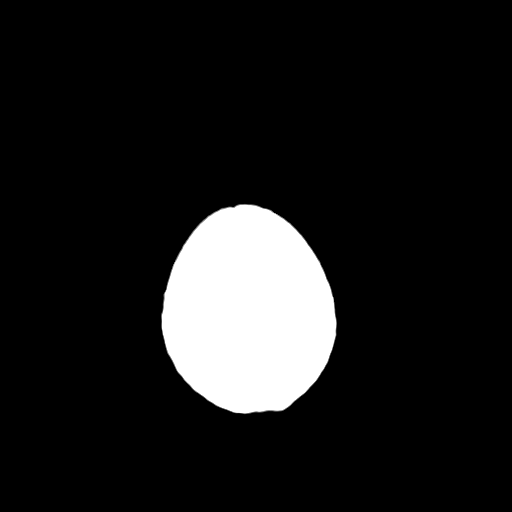
[im 32/36  brain]
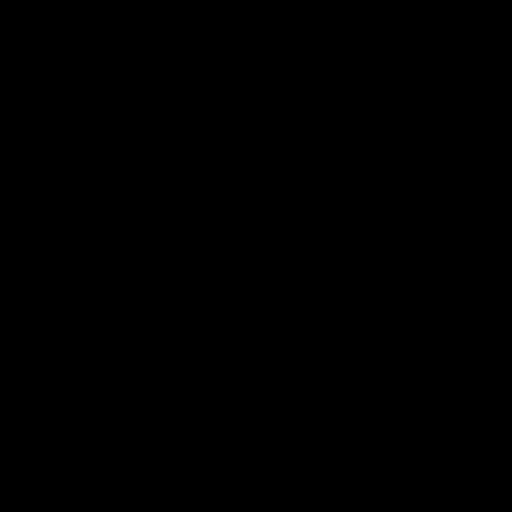
[im 34/36  brain]
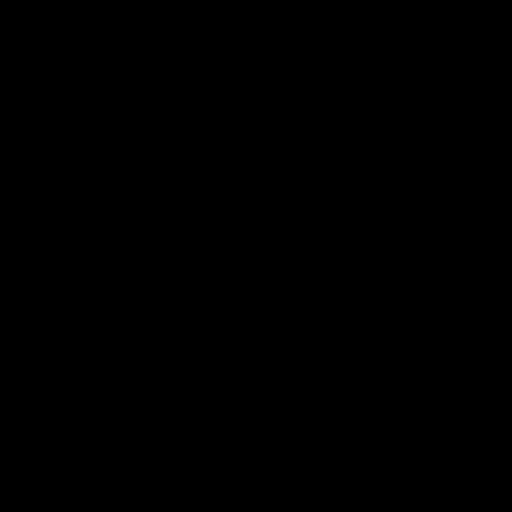

[16 of 30 positions shown; findings below may reference images not displayed]

FINDINGS: The brainstem, cerebellum, cerebral peduncles, thalami, basal
ganglia, basilar cisterns, and ventricular system appear within
normal limits. No intracranial hemorrhage, mass lesion, or acute
CVA.
IMPRESSION: 1.  No significant abnormality identified.

## 2016-08-30 IMAGING — DX DG ANKLE COMPLETE 3+V*R*
3 series · 3 of 3 positions shown · non-contrast
Comparison: None.

CLINICAL DATA: Tripped and fell today.  Right ankle pain.

EXAM:
RIGHT ANKLE - COMPLETE 3+ VIEW

[ankle ap]
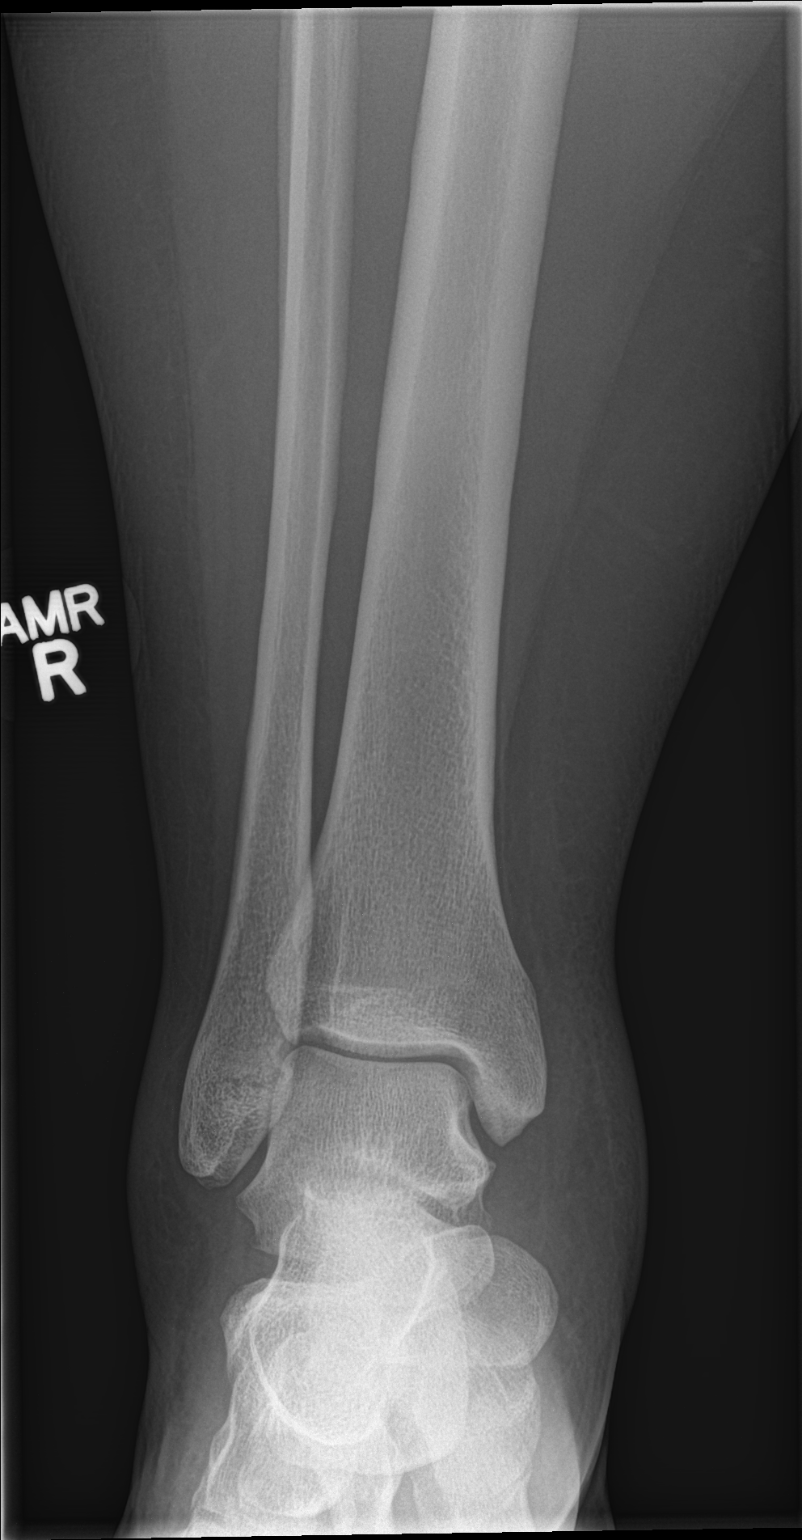

[ankle obl]
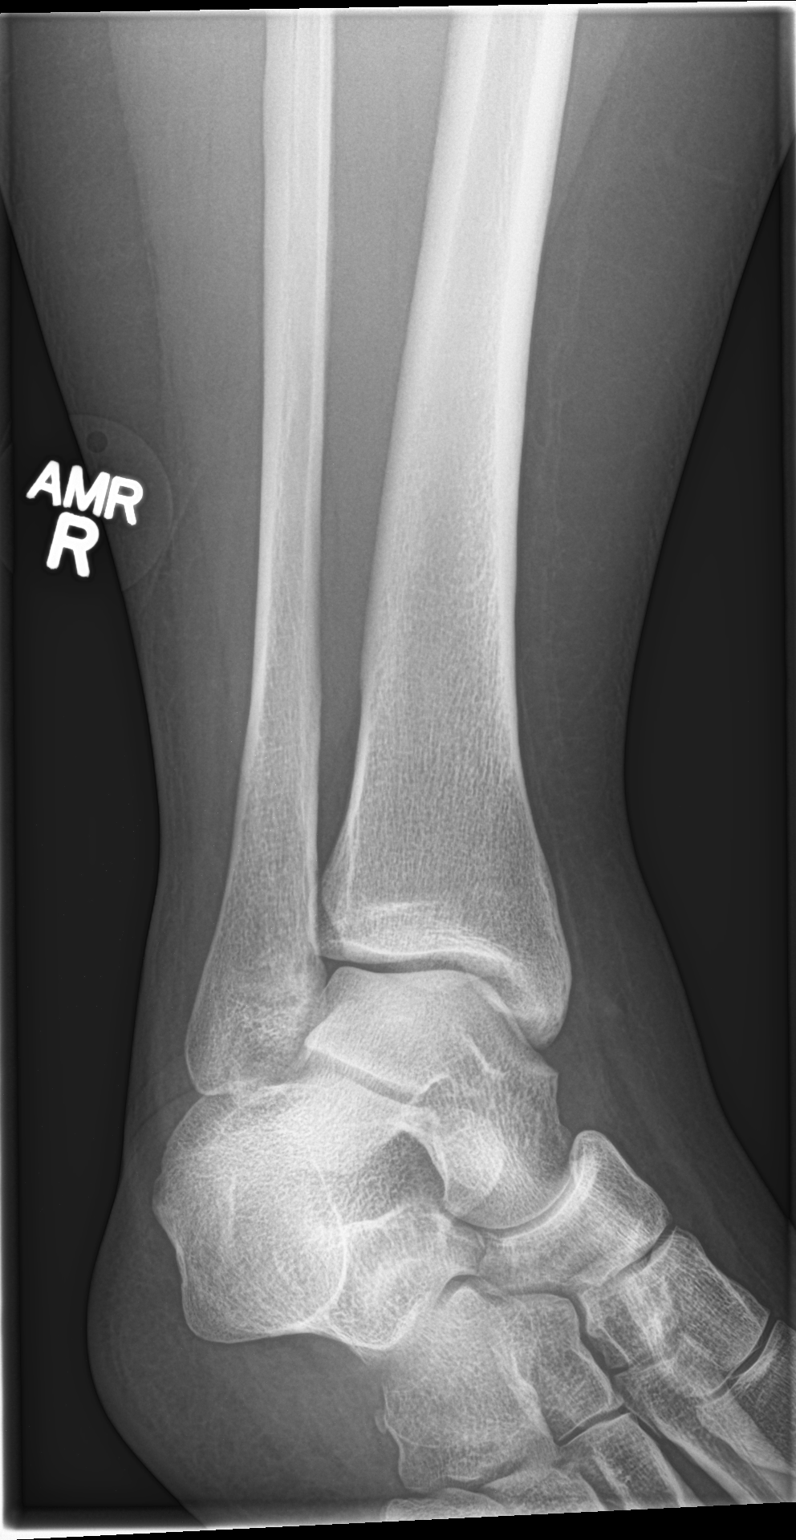

[ankle lat]
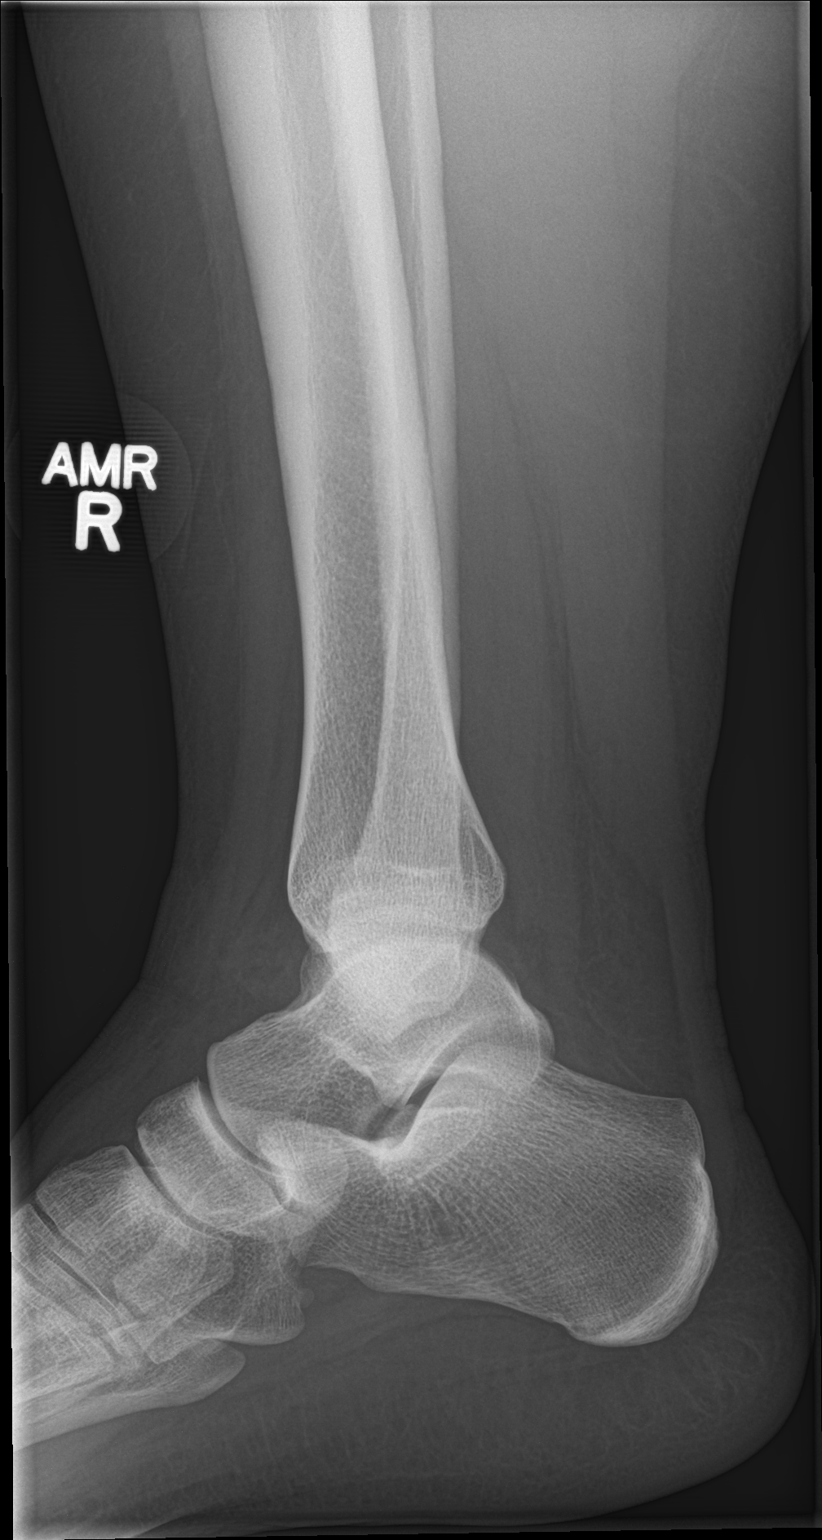

[3 of 3 positions shown; findings below may reference images not displayed]

FINDINGS: The ankle mortise is maintained. No acute fracture or osteochondral
lesion. No joint effusion. The mid and hindfoot bony structures are
intact.
IMPRESSION: No acute ankle fracture.
